# Patient Record
Sex: Female | Born: 1955 | Race: White | Hispanic: No | Marital: Married | Smoking: Former smoker
Health system: Southern US, Community
[De-identification: ages and names within clinical notes are randomized; demographics above are authoritative.]

## PROBLEM LIST (undated history)

## (undated) DIAGNOSIS — N6310 Unspecified lump in the right breast, unspecified quadrant: Secondary | ICD-10-CM

## (undated) DIAGNOSIS — E559 Vitamin D deficiency, unspecified: Secondary | ICD-10-CM

## (undated) DIAGNOSIS — E785 Hyperlipidemia, unspecified: Secondary | ICD-10-CM

## (undated) DIAGNOSIS — F419 Anxiety disorder, unspecified: Secondary | ICD-10-CM

## (undated) DIAGNOSIS — I8390 Asymptomatic varicose veins of unspecified lower extremity: Secondary | ICD-10-CM

## (undated) DIAGNOSIS — B009 Herpesviral infection, unspecified: Secondary | ICD-10-CM

## (undated) DIAGNOSIS — I739 Peripheral vascular disease, unspecified: Secondary | ICD-10-CM

## (undated) HISTORY — DX: Asymptomatic varicose veins of unspecified lower extremity: I83.90

## (undated) HISTORY — DX: Anxiety disorder, unspecified: F41.9

## (undated) HISTORY — DX: Hyperlipidemia, unspecified: E78.5

## (undated) HISTORY — DX: Vitamin D deficiency, unspecified: E55.9

## (undated) HISTORY — DX: Herpesviral infection, unspecified: B00.9

---

## 1988-11-04 HISTORY — PX: LASIK: SHX215

## 1998-03-14 ENCOUNTER — Ambulatory Visit (HOSPITAL_COMMUNITY): Admission: RE | Admit: 1998-03-14 | Discharge: 1998-03-14 | Payer: Self-pay | Admitting: *Deleted

## 1998-03-23 ENCOUNTER — Ambulatory Visit (HOSPITAL_COMMUNITY): Admission: RE | Admit: 1998-03-23 | Discharge: 1998-03-23 | Payer: Self-pay | Admitting: Obstetrics and Gynecology

## 1998-11-30 ENCOUNTER — Ambulatory Visit (HOSPITAL_COMMUNITY): Admission: RE | Admit: 1998-11-30 | Discharge: 1998-11-30 | Payer: Self-pay | Admitting: Family Medicine

## 1998-11-30 ENCOUNTER — Encounter: Payer: Self-pay | Admitting: Family Medicine

## 1999-03-26 ENCOUNTER — Ambulatory Visit (HOSPITAL_COMMUNITY): Admission: RE | Admit: 1999-03-26 | Discharge: 1999-03-26 | Payer: Self-pay | Admitting: Obstetrics and Gynecology

## 1999-03-30 ENCOUNTER — Ambulatory Visit (HOSPITAL_COMMUNITY): Admission: RE | Admit: 1999-03-30 | Discharge: 1999-03-30 | Payer: Self-pay | Admitting: Obstetrics and Gynecology

## 2000-04-01 ENCOUNTER — Ambulatory Visit (HOSPITAL_COMMUNITY): Admission: RE | Admit: 2000-04-01 | Discharge: 2000-04-01 | Payer: Self-pay | Admitting: Obstetrics and Gynecology

## 2000-04-01 ENCOUNTER — Encounter: Payer: Self-pay | Admitting: Obstetrics and Gynecology

## 2000-05-16 ENCOUNTER — Encounter: Admission: RE | Admit: 2000-05-16 | Discharge: 2000-05-16 | Payer: Self-pay | Admitting: Family Medicine

## 2000-08-13 ENCOUNTER — Encounter: Admission: RE | Admit: 2000-08-13 | Discharge: 2000-08-13 | Payer: Self-pay | Admitting: Family Medicine

## 2000-08-13 ENCOUNTER — Encounter: Payer: Self-pay | Admitting: Family Medicine

## 2000-11-12 ENCOUNTER — Encounter: Admission: RE | Admit: 2000-11-12 | Discharge: 2000-11-12 | Payer: Self-pay | Admitting: Family Medicine

## 2001-02-11 ENCOUNTER — Encounter: Admission: RE | Admit: 2001-02-11 | Discharge: 2001-02-11 | Payer: Self-pay | Admitting: Family Medicine

## 2001-02-16 ENCOUNTER — Encounter: Payer: Self-pay | Admitting: Family Medicine

## 2001-02-16 ENCOUNTER — Encounter: Admission: RE | Admit: 2001-02-16 | Discharge: 2001-02-16 | Payer: Self-pay | Admitting: Family Medicine

## 2002-03-16 ENCOUNTER — Encounter: Admission: RE | Admit: 2002-03-16 | Discharge: 2002-03-16 | Payer: Self-pay | Admitting: Family Medicine

## 2002-03-16 ENCOUNTER — Encounter: Payer: Self-pay | Admitting: Family Medicine

## 2002-06-30 ENCOUNTER — Encounter: Admission: RE | Admit: 2002-06-30 | Discharge: 2002-06-30 | Payer: Self-pay | Admitting: Family Medicine

## 2002-08-26 ENCOUNTER — Encounter: Admission: RE | Admit: 2002-08-26 | Discharge: 2002-08-26 | Payer: Self-pay | Admitting: Family Medicine

## 2002-10-15 ENCOUNTER — Other Ambulatory Visit: Admission: RE | Admit: 2002-10-15 | Discharge: 2002-10-15 | Payer: Self-pay | Admitting: Obstetrics and Gynecology

## 2003-04-15 ENCOUNTER — Encounter: Payer: Self-pay | Admitting: Sports Medicine

## 2003-04-15 ENCOUNTER — Encounter: Admission: RE | Admit: 2003-04-15 | Discharge: 2003-04-15 | Payer: Self-pay | Admitting: Sports Medicine

## 2003-09-14 ENCOUNTER — Encounter: Admission: RE | Admit: 2003-09-14 | Discharge: 2003-09-14 | Payer: Self-pay | Admitting: Family Medicine

## 2003-11-21 ENCOUNTER — Other Ambulatory Visit: Admission: RE | Admit: 2003-11-21 | Discharge: 2003-11-21 | Payer: Self-pay | Admitting: Obstetrics and Gynecology

## 2003-11-25 ENCOUNTER — Encounter: Admission: RE | Admit: 2003-11-25 | Discharge: 2003-11-25 | Payer: Self-pay | Admitting: Obstetrics and Gynecology

## 2004-04-19 ENCOUNTER — Encounter: Admission: RE | Admit: 2004-04-19 | Discharge: 2004-04-19 | Payer: Self-pay | Admitting: Obstetrics and Gynecology

## 2005-05-20 ENCOUNTER — Encounter: Admission: RE | Admit: 2005-05-20 | Discharge: 2005-05-20 | Payer: Self-pay | Admitting: Family Medicine

## 2005-06-10 ENCOUNTER — Other Ambulatory Visit: Admission: RE | Admit: 2005-06-10 | Discharge: 2005-06-10 | Payer: Self-pay | Admitting: Obstetrics and Gynecology

## 2005-06-21 ENCOUNTER — Encounter: Admission: RE | Admit: 2005-06-21 | Discharge: 2005-06-21 | Payer: Self-pay | Admitting: Family Medicine

## 2005-06-27 ENCOUNTER — Encounter: Admission: RE | Admit: 2005-06-27 | Discharge: 2005-06-27 | Payer: Self-pay | Admitting: Family Medicine

## 2005-11-28 ENCOUNTER — Encounter: Admission: RE | Admit: 2005-11-28 | Discharge: 2005-11-28 | Payer: Self-pay | Admitting: Family Medicine

## 2006-03-05 ENCOUNTER — Encounter: Payer: Self-pay | Admitting: Obstetrics and Gynecology

## 2006-07-04 ENCOUNTER — Encounter: Admission: RE | Admit: 2006-07-04 | Discharge: 2006-07-04 | Payer: Self-pay | Admitting: Obstetrics and Gynecology

## 2007-06-04 ENCOUNTER — Other Ambulatory Visit: Admission: RE | Admit: 2007-06-04 | Discharge: 2007-06-04 | Payer: Self-pay | Admitting: Family Medicine

## 2007-08-13 ENCOUNTER — Encounter: Admission: RE | Admit: 2007-08-13 | Discharge: 2007-08-13 | Payer: Self-pay | Admitting: Family Medicine

## 2007-08-18 ENCOUNTER — Encounter: Admission: RE | Admit: 2007-08-18 | Discharge: 2007-08-18 | Payer: Self-pay | Admitting: Family Medicine

## 2007-12-24 ENCOUNTER — Encounter: Admission: RE | Admit: 2007-12-24 | Discharge: 2007-12-24 | Payer: Self-pay | Admitting: Family Medicine

## 2008-04-20 ENCOUNTER — Encounter: Admission: RE | Admit: 2008-04-20 | Discharge: 2008-04-20 | Payer: Self-pay | Admitting: Gastroenterology

## 2008-07-28 ENCOUNTER — Other Ambulatory Visit: Admission: RE | Admit: 2008-07-28 | Discharge: 2008-07-28 | Payer: Self-pay | Admitting: Family Medicine

## 2008-08-24 ENCOUNTER — Encounter: Admission: RE | Admit: 2008-08-24 | Discharge: 2008-08-24 | Payer: Self-pay | Admitting: Family Medicine

## 2008-09-12 ENCOUNTER — Encounter: Admission: RE | Admit: 2008-09-12 | Discharge: 2008-09-12 | Payer: Self-pay | Admitting: Family Medicine

## 2009-10-10 ENCOUNTER — Encounter: Admission: RE | Admit: 2009-10-10 | Discharge: 2009-10-10 | Payer: Self-pay | Admitting: Family Medicine

## 2010-02-09 ENCOUNTER — Other Ambulatory Visit: Admission: RE | Admit: 2010-02-09 | Discharge: 2010-02-09 | Payer: Self-pay | Admitting: Family Medicine

## 2010-02-21 ENCOUNTER — Ambulatory Visit: Payer: Self-pay | Admitting: Vascular Surgery

## 2010-03-05 ENCOUNTER — Ambulatory Visit (HOSPITAL_COMMUNITY): Admission: RE | Admit: 2010-03-05 | Discharge: 2010-03-05 | Payer: Self-pay | Admitting: Family Medicine

## 2010-11-24 ENCOUNTER — Encounter: Payer: Self-pay | Admitting: Family Medicine

## 2010-11-25 ENCOUNTER — Encounter: Payer: Self-pay | Admitting: Family Medicine

## 2010-11-25 ENCOUNTER — Encounter: Payer: Self-pay | Admitting: Obstetrics and Gynecology

## 2011-03-19 NOTE — Consult Note (Signed)
NEW PATIENT CONSULTATION   Lauren Fry, TIEGS [facility]  DOB:  February 10, 1956                                       02/21/2010  NWGNF#:[id number]   The patient presents today for evaluation of bilateral leg discomfort  and vein pathology.  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  She has  not had any history of DVT or bleeding from these.  She does have aching  over this area with what she describes as a shooting pain quality over  her pretibial areas bilaterally.  She does not relate this to long  periods of standing.   PAST MEDICAL HISTORY:  Her past history is negative for major medical  difficulties specifically no cardiac disease and no hypertension.   FAMILY HISTORY:  Is positive for significant varicosities in her mother,  had a pacemaker placed in her father.   SOCIAL HISTORY:  She has three children.  She works as a Animator.  She quit smoking 25 years ago and has approximately 3  glasses of wine per week.   REVIEW OF SYSTEMS:  Review of systems is completely negative aside from  documented above.   PHYSICAL EXAM:  General:  A well-developed, thin white female appearing  stated age.  Vital signs:  Blood pressure is 103/65, pulse 67,  respirations 18.  In general she is well-developed and in no acute  distress.  HEENT:  Normal.  Musculoskeletal:  No major deformities or  cyanosis.  Neurological:  No focal weakness, paresthesias.  Skin:  Without ulcers or rashes.  She does have prominent telangiectasia over  her thighs and most markedly down on to her pretibial area and on to her  ankles.  She does have some reticular veins as well.   She underwent formal venous duplex in our office and this reveals no  evidence of DVT.  She does have mild deep venous reflux.  She does have  mild insignificant reflux in her  right great saphenous vein and slightly  more significant reflux in her left great saphenous vein.  This is only  when she is standing in upright position.   I had a long discussion with the patient.  I explained that she does  have mild incompetence but certainly would not recommend any aggressive  treatment such as laser ablation of her saphenous vein due to this.  I  do not feel that her main component of concern which is the shooting  pains in her pretibial area is related to her venous pathology.  I did  discuss the potential treatment for her telangiectasia and reticular  veins.  I explained this would be sclerotherapy and would certainly give  an improved cosmetic result.  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  She will notify us should she wish to proceed with  sclerotherapy of her telangiectasia.     Larina Earthly, M.D.  Electronically Signed   TFE/MEDQ  D:  02/21/2010  T:  02/22/2010  Job:  [id number]   cc:   Stacie Acres. Cliffton Asters, M.D.

## 2011-03-19 NOTE — Procedures (Signed)
LOWER EXTREMITY VENOUS REFLUX EXAM   INDICATION:  Bilateral telangiectasia.   EXAM:  Using color-flow imaging and pulse Doppler spectral analysis, the  right and left common femoral, superficial femoral, popliteal, posterior  tibial, greater and lesser saphenous veins are evaluated.  There is  evidence suggesting deep venous insufficiency in the right and left  lower extremities.   The left saphenofemoral junction is not competent with Reflux of  >571milliseconds. The right is competent.  The left GSV is not competent  when upright with reflux of >500 milliseconds with the caliber as  described below.  The right is within normal limits.   The right and left proximal short saphenous veins demonstrate  competency.   GSV Diameter (used if found to be incompetent only)                                            Right            Left  Proximal Greater Saphenous Vein           cm               0.57 cm  Proximal-to-mid-thigh                     cm               cm  Mid thigh                                 cm               0.44 cm  Mid-distal thigh                          cm               cm  Distal thigh                              cm               0.45 cm  Knee                                      cm               0.4 cm   IMPRESSION:  1. When upright, left greater saphenous vein reflux with      >5100milliseconds is identified with the caliber ranging from 0.4 cm      to 0.57 cm knee to groin.  The right is within normal limits.  2. The right and left greater saphenous veins are not aneurysmal.  3. The right and left greater saphenous veins are not tortuous.  4. The deep venous system is not competent with reflux of      >536milliseconds.  5. The right and left lesser saphenous veins are competent.         ___________________________________________  Larina Earthly, M.D.   AS/MEDQ  D:  02/21/2010  T:  02/21/2010  Job:  [id number]

## 2011-04-18 ENCOUNTER — Ambulatory Visit
Admission: RE | Admit: 2011-04-18 | Discharge: 2011-04-18 | Disposition: A | Discharge status: Home / Self Care | Payer: BC Managed Care – PPO | Source: Ambulatory Visit | Attending: Family Medicine | Admitting: Family Medicine

## 2011-04-18 ENCOUNTER — Other Ambulatory Visit: Payer: Self-pay | Admitting: Family Medicine

## 2011-04-18 DIAGNOSIS — Z1231 Encounter for screening mammogram for malignant neoplasm of breast: Secondary | ICD-10-CM

## 2012-06-01 ENCOUNTER — Other Ambulatory Visit (HOSPITAL_COMMUNITY): Payer: Self-pay | Admitting: Internal Medicine

## 2012-06-01 ENCOUNTER — Other Ambulatory Visit: Payer: Self-pay | Admitting: Internal Medicine

## 2012-06-01 DIAGNOSIS — Q782 Osteopetrosis: Secondary | ICD-10-CM

## 2012-06-01 DIAGNOSIS — Z1231 Encounter for screening mammogram for malignant neoplasm of breast: Secondary | ICD-10-CM

## 2012-06-02 ENCOUNTER — Ambulatory Visit (HOSPITAL_COMMUNITY)
Admission: RE | Admit: 2012-06-02 | Discharge: 2012-06-02 | Disposition: A | Discharge status: Home / Self Care | Payer: BC Managed Care – PPO | Source: Ambulatory Visit | Attending: Internal Medicine | Admitting: Internal Medicine

## 2012-06-02 DIAGNOSIS — Z780 Asymptomatic menopausal state: Secondary | ICD-10-CM | POA: Insufficient documentation

## 2012-06-02 DIAGNOSIS — Q782 Osteopetrosis: Secondary | ICD-10-CM

## 2012-06-02 DIAGNOSIS — Z13820 Encounter for screening for osteoporosis: Secondary | ICD-10-CM | POA: Insufficient documentation

## 2012-06-03 ENCOUNTER — Other Ambulatory Visit: Payer: Self-pay | Admitting: Emergency Medicine

## 2012-06-03 ENCOUNTER — Other Ambulatory Visit (HOSPITAL_COMMUNITY)
Admission: RE | Admit: 2012-06-03 | Discharge: 2012-06-03 | Disposition: A | Discharge status: Home / Self Care | Payer: BC Managed Care – PPO | Source: Ambulatory Visit | Attending: Unknown Physician Specialty | Admitting: Unknown Physician Specialty

## 2012-06-03 ENCOUNTER — Ambulatory Visit
Admission: RE | Admit: 2012-06-03 | Discharge: 2012-06-03 | Disposition: A | Discharge status: Home / Self Care | Payer: BC Managed Care – PPO | Source: Ambulatory Visit | Attending: Internal Medicine | Admitting: Internal Medicine

## 2012-06-03 DIAGNOSIS — Z01419 Encounter for gynecological examination (general) (routine) without abnormal findings: Secondary | ICD-10-CM | POA: Insufficient documentation

## 2012-06-03 DIAGNOSIS — Z1231 Encounter for screening mammogram for malignant neoplasm of breast: Secondary | ICD-10-CM

## 2012-06-03 LAB — HM MAMMOGRAPHY: HM Mammogram: NEGATIVE

## 2012-06-04 LAB — HM PAP SMEAR: HM Pap smear: NEGATIVE

## 2013-02-02 ENCOUNTER — Other Ambulatory Visit: Payer: Self-pay | Admitting: Gastroenterology

## 2013-02-02 DIAGNOSIS — R109 Unspecified abdominal pain: Secondary | ICD-10-CM

## 2013-02-05 ENCOUNTER — Ambulatory Visit
Admission: RE | Admit: 2013-02-05 | Discharge: 2013-02-05 | Disposition: A | Discharge status: Home / Self Care | Payer: BC Managed Care – PPO | Source: Ambulatory Visit | Attending: Gastroenterology | Admitting: Gastroenterology

## 2013-02-05 DIAGNOSIS — R109 Unspecified abdominal pain: Secondary | ICD-10-CM

## 2013-09-09 ENCOUNTER — Telehealth: Payer: Self-pay | Admitting: *Deleted

## 2013-09-10 NOTE — Telephone Encounter (Signed)
Please advise what you would like for me to do for this pt.

## 2013-09-10 NOTE — Telephone Encounter (Signed)
This patient will actually need an appointment here before we can submit order for diagnostic mammogram.

## 2013-09-12 NOTE — Telephone Encounter (Signed)
Did this patient get scheduled for an office visit as I instructed previously. Please and thank you.

## 2013-09-13 ENCOUNTER — Encounter: Payer: Self-pay | Admitting: Emergency Medicine

## 2013-09-13 ENCOUNTER — Ambulatory Visit: Payer: BC Managed Care – PPO | Admitting: Emergency Medicine

## 2013-09-13 VITALS — BP 104/58 | HR 64 | Temp 98.0°F | Resp 16 | Ht 67.0 in | Wt 134.0 lb

## 2013-09-13 DIAGNOSIS — N644 Mastodynia: Secondary | ICD-10-CM

## 2013-09-13 DIAGNOSIS — E785 Hyperlipidemia, unspecified: Secondary | ICD-10-CM | POA: Insufficient documentation

## 2013-09-13 DIAGNOSIS — E559 Vitamin D deficiency, unspecified: Secondary | ICD-10-CM | POA: Insufficient documentation

## 2013-09-13 DIAGNOSIS — F419 Anxiety disorder, unspecified: Secondary | ICD-10-CM

## 2013-09-13 NOTE — Progress Notes (Signed)
  Subjective:    Patient ID: Lauren Fry, female    DOB: 1956-05-28, 57 y.o.   MRN: [id number]  HPI Comments: 57 yo female presents with left breast pain/ tenderness without strain or injury or noticeable lump. Patient admits to mild bruising over area of concern, but notes bruises easily. Patient is due for [facility] and has NEG PMH/ [facility] for female cancer. She has 2 1st line relatives with colon ca.   MEDICATIONS Celexa 40mg   Asa 81mg  Os-cal 600mg  D 5000IU Fish oil 2g Mag 250mg  MVIT Ocuvite Vit C 1g Vit E 400  ALLERGIES NKDA   Review of Systems  Constitutional:       Left breast pain   All other systems reviewed and are negative.       Objective:   Physical Exam  Nursing note and vitals reviewed. Constitutional: She appears well-developed and well-nourished.  Neck: Normal range of motion.  Cardiovascular: Normal rate, regular rhythm, normal heart sounds and intact distal pulses.   Pulmonary/Chest: Effort normal and breath sounds normal.  Genitourinary:  Left breast 2-3 o'clock with tenderness, without obvious mass, with mild ecchymosis.  Musculoskeletal: Normal range of motion.  Neurological: She is alert.  Skin: Skin is warm and dry.  Psychiatric: She has a normal mood and affect. Her behavior is normal. Judgment and thought content normal.          Assessment & Plan:  Left breast tenderness, get yearly screening of both and DX and u/s of left breast.

## 2013-09-13 NOTE — Patient Instructions (Signed)
Breast Tenderness Breast tenderness is a common problem for women of all ages. Breast tenderness may cause mild discomfort to severe pain. It has a variety of causes. Your health care provider will find out the likely cause of your breast tenderness by examining your breasts, asking you about symptoms, and ordering some tests. Breast tenderness usually does not mean you have breast cancer. HOME CARE INSTRUCTIONS  Breast tenderness often can be handled at home. You can try:  Getting fitted for a new bra that provides more support, especially during exercise.  Wearing a more supportive bra or sports bra while sleeping when your breasts are very tender.  If you have a breast injury, apply ice to the area:  Put ice in a plastic bag.  Place a towel between your skin and the bag.  Leave the ice on for 20 minutes, 2 3 times a day.  If your breasts are too full of milk as a result of breastfeeding, try:  Expressing milk either by hand or with a breast pump.  Applying a warm compress to the breasts for relief.  Taking over-the-counter pain relievers, if approved by your health care provider.  Taking other medicines that your health care provider prescribes. These may include antibiotic medicines or birth control pills. Over the long term, your breast tenderness might be eased if you:  Cut down on caffeine.  Reduce the amount of fat in your diet. Keep a log of the days and times when your breasts are most tender. This will help you and your health care provider find the cause of the tenderness and how to relieve it. Also, learn how to do breast exams at home. This will help you notice if you have an unusual growth or lump that could cause tenderness. SEEK MEDICAL CARE IF:   Any part of your breast is hard, red, and hot to the touch. This could be a sign of infection.  Fluid is coming out of your nipples (and you are not breastfeeding). Especially watch for blood or pus.  You have a fever  as well as breast tenderness.  You have a new or painful lump in your breast that remains after your menstrual period ends.  You have tried to take care of the pain at home, but it has not gone away.  Your breast pain is getting worse, or the pain is making it hard to do the things you usually do during your day. Document Released: 10/03/2008 Document Revised: 06/23/2013 Document Reviewed: 05/20/2013 Exit[employer] Patient Information 2014 [employer].  

## 2013-10-07 ENCOUNTER — Ambulatory Visit
Admission: RE | Admit: 2013-10-07 | Discharge: 2013-10-07 | Disposition: A | Discharge status: Home / Self Care | Payer: BC Managed Care – PPO | Source: Ambulatory Visit | Attending: Emergency Medicine | Admitting: Emergency Medicine

## 2013-10-07 DIAGNOSIS — N644 Mastodynia: Secondary | ICD-10-CM

## 2013-12-03 ENCOUNTER — Encounter (HOSPITAL_COMMUNITY): Payer: Self-pay | Admitting: Emergency Medicine

## 2013-12-03 ENCOUNTER — Emergency Department (HOSPITAL_COMMUNITY)
Admission: EM | Admit: 2013-12-03 | Discharge: 2013-12-03 | Disposition: A | Discharge status: Home / Self Care | Payer: BC Managed Care – PPO | Attending: Emergency Medicine | Admitting: Emergency Medicine

## 2013-12-03 ENCOUNTER — Emergency Department (HOSPITAL_COMMUNITY): Payer: BC Managed Care – PPO

## 2013-12-03 DIAGNOSIS — Z8639 Personal history of other endocrine, nutritional and metabolic disease: Secondary | ICD-10-CM | POA: Insufficient documentation

## 2013-12-03 DIAGNOSIS — E785 Hyperlipidemia, unspecified: Secondary | ICD-10-CM | POA: Insufficient documentation

## 2013-12-03 DIAGNOSIS — Z8619 Personal history of other infectious and parasitic diseases: Secondary | ICD-10-CM | POA: Insufficient documentation

## 2013-12-03 DIAGNOSIS — F411 Generalized anxiety disorder: Secondary | ICD-10-CM | POA: Insufficient documentation

## 2013-12-03 DIAGNOSIS — S20219A Contusion of unspecified front wall of thorax, initial encounter: Secondary | ICD-10-CM

## 2013-12-03 DIAGNOSIS — Z7982 Long term (current) use of aspirin: Secondary | ICD-10-CM | POA: Insufficient documentation

## 2013-12-03 DIAGNOSIS — Z87891 Personal history of nicotine dependence: Secondary | ICD-10-CM | POA: Insufficient documentation

## 2013-12-03 DIAGNOSIS — R079 Chest pain, unspecified: Secondary | ICD-10-CM | POA: Insufficient documentation

## 2013-12-03 DIAGNOSIS — Z79899 Other long term (current) drug therapy: Secondary | ICD-10-CM | POA: Insufficient documentation

## 2013-12-03 DIAGNOSIS — G8911 Acute pain due to trauma: Secondary | ICD-10-CM | POA: Insufficient documentation

## 2013-12-03 LAB — CBC
HCT: 38.7 % (ref 36.0–46.0)
Hemoglobin: 12.9 g/dL (ref 12.0–15.0)
MCH: 31.2 pg (ref 26.0–34.0)
MCHC: 33.3 g/dL (ref 30.0–36.0)
MCV: 93.7 fL (ref 78.0–100.0)
Platelets: 172 10*3/uL (ref 150–400)
RBC: 4.13 MIL/uL (ref 3.87–5.11)
RDW: 13 % (ref 11.5–15.5)
WBC: 6 10*3/uL (ref 4.0–10.5)

## 2013-12-03 LAB — BASIC METABOLIC PANEL
BUN: 19 mg/dL (ref 6–23)
CO2: 25 meq/L (ref 19–32)
Calcium: 9.1 mg/dL (ref 8.4–10.5)
Chloride: 98 mEq/L (ref 96–112)
Creatinine, Ser: 0.72 mg/dL (ref 0.50–1.10)
GFR calc Af Amer: >90 mL/min (ref >90)
Glucose, Bld: 93 mg/dL (ref 70–99)
POTASSIUM: 4.4 meq/L (ref 3.7–5.3)
SODIUM: 136 meq/L — AB (ref 137–147)

## 2013-12-03 LAB — POCT I-STAT TROPONIN I: TROPONIN I, POC: 0 ng/mL (ref 0.00–0.08)

## 2013-12-03 MED ORDER — HYDROCODONE-ACETAMINOPHEN 5-325 MG PO TABS: 2.0000 | ORAL_TABLET | ORAL | Status: DC | PRN

## 2013-12-03 MED ORDER — IBUPROFEN 800 MG PO TABS: 800.0000 mg | ORAL_TABLET | Freq: Three times a day (TID) | ORAL | Status: DC

## 2013-12-03 NOTE — ED Notes (Signed)
The tech assisted the patient to restroom, the patients gait was steady. The patient complained of heavy pressure on left side; while ambulating to and from restroom. The tech has reported to RN in charge.

## 2013-12-03 NOTE — Discharge Instructions (Signed)
Chest Contusion °A chest contusion is a deep bruise on your chest area. Contusions are the result of an injury that caused bleeding under the skin. A chest contusion may involve bruising of the skin, muscles, or ribs. The contusion may turn blue, purple, or yellow. Minor injuries will give you a painless contusion, but more severe contusions may stay painful and swollen for a few weeks. °CAUSES  °A contusion is usually caused by a blow, trauma, or direct force to an area of the body. °SYMPTOMS  °· Swelling and redness of the injured area. °· Discoloration of the injured area. °· Tenderness and soreness of the injured area. °· Pain. °DIAGNOSIS  °The diagnosis can be made by taking a history and performing a physical exam. An X-ray, CT scan, or MRI may be needed to determine if there were any associated injuries, such as broken bones (fractures) or internal injuries. °TREATMENT  °Often, the best treatment for a chest contusion is resting, icing, and applying cold compresses to the injured area. Deep breathing exercises may be recommended to reduce the risk of pneumonia. Over-the-counter medicines may also be recommended for pain control. °HOME CARE INSTRUCTIONS  °· Put ice on the injured area. °· Put ice in a plastic bag. °· Place a towel between your skin and the bag. °· Leave the ice on for 15-20 minutes, 03-04 times a day. °· Only take over-the-counter or prescription medicines as directed by your caregiver. Your caregiver may recommend avoiding anti-inflammatory medicines (aspirin, ibuprofen, and naproxen) for 48 hours because these medicines may increase bruising. °· Rest the injured area. °· Perform deep-breathing exercises as directed by your caregiver. °· Stop smoking if you smoke. °· Do not lift objects over 5 pounds (2.3 kg) for 3 days or longer if recommended by your caregiver. °SEEK IMMEDIATE MEDICAL CARE IF:  °· You have increased bruising or swelling. °· You have pain that is getting worse. °· You have  difficulty breathing. °· You have dizziness, weakness, or fainting. °· You have blood in your urine or stool. °· You cough up or vomit blood. °· Your swelling or pain is not relieved with medicines. °MAKE SURE YOU:  °· Understand these instructions. °· Will watch your condition. °· Will get help right away if you are not doing well or get worse. °Document Released: 07/16/2001 Document Revised: 07/15/2012 Document Reviewed: 04/13/2012 °ExitCare® Patient Information ©2014 [employer]. ° °
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
## 2013-12-05 NOTE — ED Provider Notes (Signed)
Medical screening examination/treatment/procedure(s) were performed by non-physician practitioner and as supervising physician I was immediately available for consultation/collaboration.  EKG Interpretation    Date/Time:  Friday December 03 2013 17:49:06 EST Ventricular Rate:  58 PR Interval:  94 QRS Duration: 84 QT Interval:  430 QTC Calculation: 422 R Axis:   83 Text Interpretation:  Sinus bradycardia with short PR Otherwise normal ECG Confirmed by Alvino Chapel  MD, Joscelyn Hardrick ([id number]) on 12/05/2013 7:35:40 AM             Jasper Riling. Alvino Chapel, MD 12/05/13 [phone]

## 2014-01-31 ENCOUNTER — Other Ambulatory Visit: Payer: Self-pay | Admitting: Internal Medicine

## 2014-02-14 ENCOUNTER — Encounter: Payer: Self-pay | Admitting: Internal Medicine

## 2014-03-09 ENCOUNTER — Ambulatory Visit (INDEPENDENT_AMBULATORY_CARE_PROVIDER_SITE_OTHER): Payer: BC Managed Care – PPO | Admitting: Internal Medicine

## 2014-03-09 ENCOUNTER — Encounter: Payer: Self-pay | Admitting: Internal Medicine

## 2014-03-09 VITALS — BP 114/68 | HR 56 | Temp 97.9°F | Resp 16 | Ht 67.0 in | Wt 135.6 lb

## 2014-03-09 DIAGNOSIS — R030 Elevated blood-pressure reading, without diagnosis of hypertension: Secondary | ICD-10-CM

## 2014-03-09 DIAGNOSIS — Z79899 Other long term (current) drug therapy: Secondary | ICD-10-CM

## 2014-03-09 DIAGNOSIS — Z111 Encounter for screening for respiratory tuberculosis: Secondary | ICD-10-CM

## 2014-03-09 DIAGNOSIS — Z1212 Encounter for screening for malignant neoplasm of rectum: Secondary | ICD-10-CM

## 2014-03-09 DIAGNOSIS — Z23 Encounter for immunization: Secondary | ICD-10-CM

## 2014-03-09 DIAGNOSIS — R7402 Elevation of levels of lactic acid dehydrogenase (LDH): Secondary | ICD-10-CM

## 2014-03-09 DIAGNOSIS — R740 Nonspecific elevation of levels of transaminase and lactic acid dehydrogenase [LDH]: Secondary | ICD-10-CM

## 2014-03-09 DIAGNOSIS — E559 Vitamin D deficiency, unspecified: Secondary | ICD-10-CM

## 2014-03-09 DIAGNOSIS — I10 Essential (primary) hypertension: Secondary | ICD-10-CM

## 2014-03-09 DIAGNOSIS — Z0000 Encounter for general adult medical examination without abnormal findings: Secondary | ICD-10-CM

## 2014-03-09 DIAGNOSIS — Z113 Encounter for screening for infections with a predominantly sexual mode of transmission: Secondary | ICD-10-CM

## 2014-03-09 LAB — HEMOGLOBIN A1C
HEMOGLOBIN A1C: 5.1 % (ref <5.7)
Mean Plasma Glucose: 100 mg/dL (ref <117)

## 2014-03-09 LAB — CBC WITH DIFFERENTIAL/PLATELET
Basophils Absolute: 0.1 10*3/uL (ref 0.0–0.1)
Basophils Relative: 1 % (ref 0–1)
EOS ABS: 0.2 10*3/uL (ref 0.0–0.7)
EOS PCT: 3 % (ref 0–5)
HEMATOCRIT: 40.6 % (ref 36.0–46.0)
HEMOGLOBIN: 13.4 g/dL (ref 12.0–15.0)
LYMPHS PCT: 31 % (ref 12–46)
Lymphs Abs: 1.7 10*3/uL (ref 0.7–4.0)
MCH: 31.2 pg (ref 26.0–34.0)
MCHC: 33 g/dL (ref 30.0–36.0)
MCV: 94.4 fL (ref 78.0–100.0)
MONO ABS: 0.4 10*3/uL (ref 0.1–1.0)
MONOS PCT: 8 % (ref 3–12)
Neutro Abs: 3.1 10*3/uL (ref 1.7–7.7)
Neutrophils Relative %: 57 % (ref 43–77)
Platelets: 231 10*3/uL (ref 150–400)
RBC: 4.3 MIL/uL (ref 3.87–5.11)
RDW: 13.8 % (ref 11.5–15.5)
WBC: 5.4 10*3/uL (ref 4.0–10.5)

## 2014-03-09 NOTE — Progress Notes (Signed)
Patient ID: Lauren Fry, female   DOB: 10-Nov-1955, 58 y.o.   MRN: [id number]   Annual Screening Comprehensive Examination   This very nice 58 y.o.DWF presents for complete physical.  Patient has no major health issues. She has had slightly elevated chol & LDL in the past which improved dramatically with diet to goal. She exercises regularly with aerobics and occasionally with weights. In jan 2012 her A1c was 5.7% and dropped to 5.3% in Mar 2014.    Finally, patient has history of Vitamin D Deficiency which she supplements.  Medication Sig  . aspirin 81 MG chewable tablet Chew 81 mg by mouth daily.  . OS-CAL 600 mg Take 600 mg by mouth 2 (two) times daily with a meal.  . cholecalciferol (VITAMIN D)  Take 5,000 Units by mouth daily.   . citalopram (CELEXA) 40 MG tablet TAKE 1 TABLET BY MOUTH EVERY DAY FOR MOOD  . fish oil 1000 MG  Take 1 g by mouth 2 (two) times daily.   Marland Kitchen ibuprofen (ADVIL,MOTRIN) 200 MG tablet Take 600 mg by mouth daily as needed for mild pain.  . Magnesium 250 MG TABS Take 250 mg by mouth daily.   . Multiple Vitamins-Minerals  Take 1 tablet by mouth daily.  .  (OCUVITE-LUTEIN) CAPS Take 1 capsule by mouth daily.  . vitamin C 500 MG  Take 500 mg by mouth 2 (two) times daily.  . vitamin E 400 UNIT capsule Take 400 Units by mouth daily.   No Known Allergies  Past Medical History  Diagnosis Date  . Anxiety   . Hyperlipidemia   . Unspecified vitamin D deficiency   . HSV-2 (herpes simplex virus 2) infection    Past Surgical History  Procedure Laterality Date  . Lasik  1990  . Lasik  1990   Family History  Problem Relation Age of Onset  . Cancer Mother 51    lung  . Dementia Father   . Cancer Sister 16    colon  . Cancer Brother 52    colon   History   Social History  . Marital Status: Divorced    Spouse Name: N/A    Number of Children: N/A  . Years of Education: N/A   Occupational History  . Not on file.   Social History Main Topics  .  Smoking status: Former Smoker    Quit date: 11/04/1982  . Smokeless tobacco: Not on file  . Alcohol Use: 0.6 oz/week    1 Glasses of wine per week  . Drug Use: No  . Sexual Activity: Yes    Birth Control/ Protection: None     ROS Constitutional: Denies fever, chills, weight loss/gain, headaches, insomnia, fatigue, night sweats, and change in appetite. Eyes: Denies redness, blurred vision, diplopia, discharge, itchy, watery eyes.  ENT: Denies discharge, congestion, post nasal drip, epistaxis, sore throat, earache, hearing loss, dental pain, Tinnitus, Vertigo, Sinus pain, snoring.  Cardio: Denies chest pain, palpitations, irregular heartbeat, syncope, dyspnea, diaphoresis, orthopnea, PND, claudication, edema Respiratory: denies cough, dyspnea, DOE, pleurisy, hoarseness, laryngitis, wheezing.  Gastrointestinal: Denies dysphagia, heartburn, reflux, water brash, pain, cramps, nausea, vomiting, bloating, diarrhea, constipation, hematemesis, melena, hematochezia, jaundice, hemorrhoids Genitourinary: Denies dysuria, frequency, urgency, nocturia, hesitancy, discharge, hematuria, flank pain Breast: Breast lumps, nipple discharge, bleeding.  Musculoskeletal: Denies arthralgia, myalgia, stiffness, Jt. Swelling, pain, limp, and strain/sprain. Skin: Denies puritis, rash, hives, warts, acne, eczema, changing in skin lesion Neuro: Weakness, tremor, incoordination, spasms, paresthesia, pain Psychiatric: Denies confusion, memory loss, sensory  loss. Endocrine: Denies change in weight, skin, hair change, nocturia, and paresthesia, diabetic polys, visual blurring, hyper /hypo glycemic episodes.  Heme/Lymph: No excessive bleeding, bruising, enlarged lymph nodes.   Physical Exam  BP 114/68  Pulse 56  Temp97.9 F (36.6 C)   Resp 16  Ht 5\' 7"    Wt 135 lb 9.6 oz   BMI 21.23 kg/m2   General Appearance: Well nourished, in no apparent distress. Eyes: PERRLA, EOMs, conjunctiva no swelling or erythema,  normal fundi and vessels. Sinuses: No frontal/maxillary tenderness ENT/Mouth: EACs patent / TMs  nl. Nares clear without erythema, swelling, mucoid exudates. Oral hygiene is good. No erythema, swelling, or exudate. Tongue normal, non-obstructing. Tonsils not swollen or erythematous. Hearing normal.  Neck: Supple, thyroid normal. No bruits, nodes or JVD. Respiratory: Respiratory effort normal.  BS equal and clear bilateral without rales, rhonci, wheezing or stridor. Cardio: Heart sounds are normal with regular rate and rhythm and no murmurs, rubs or gallops. Peripheral pulses are normal and equal bilaterally without edema. No aortic or femoral bruits. Chest: symmetric with normal excursions and percussion. Breasts: Symmetric, without lumps, nipple discharge, retractions, or fibrocystic changes.  Abdomen: Flat, soft, with bowl sounds. Nontender, no guarding, rebound, hernias, masses, or organomegaly.  Lymphatics: Non tender without lymphadenopathy.  Musculoskeletal: Full ROM all peripheral extremities, joint stability, 5/5 strength, and normal gait. Skin: Warm and dry without rashes, lesions, cyanosis, clubbing or  ecchymosis.  Neuro: Cranial nerves intact, reflexes equal bilaterally. Normal muscle tone, no cerebellar symptoms. Sensation intact.  Pysch: Awake and oriented X 3, normal affect, Insight and Judgment appropriate.   Assessment and Plan  1. Annual Screening Examination 2. Vitamin D Deficiency  Continue prudent diet as discussed, weight control, regular exercise, and medications. Routine screening labs and tests as requested with regular follow-up as recommended.

## 2014-03-09 NOTE — Patient Instructions (Signed)

## 2014-03-10 LAB — RPR

## 2014-03-10 LAB — MICROALBUMIN / CREATININE URINE RATIO
CREATININE, URINE: 48.9 mg/dL
MICROALB UR: 0.5 mg/dL (ref 0.00–1.89)
Microalb Creat Ratio: 10.2 mg/g (ref 0.0–30.0)

## 2014-03-10 LAB — BASIC METABOLIC PANEL WITH GFR
BUN: 16 mg/dL (ref 6–23)
CALCIUM: 9 mg/dL (ref 8.4–10.5)
CO2: 23 meq/L (ref 19–32)
CREATININE: 0.73 mg/dL (ref 0.50–1.10)
Chloride: 101 mEq/L (ref 96–112)
GFR, Est African American: >89 mL/min
GFR, Est Non African American: >89 mL/min
GLUCOSE: 80 mg/dL (ref 70–99)
Potassium: 4.5 mEq/L (ref 3.5–5.3)
Sodium: 138 mEq/L (ref 135–145)

## 2014-03-10 LAB — HEPATIC FUNCTION PANEL
ALBUMIN: 4.3 g/dL (ref 3.5–5.2)
ALT: 14 U/L (ref 0–35)
AST: 23 U/L (ref 0–37)
Alkaline Phosphatase: 56 U/L (ref 39–117)
Bilirubin, Direct: 0.1 mg/dL (ref 0.0–0.3)
Indirect Bilirubin: 0.5 mg/dL (ref 0.2–1.2)
Total Bilirubin: 0.6 mg/dL (ref 0.2–1.2)
Total Protein: 6.6 g/dL (ref 6.0–8.3)

## 2014-03-10 LAB — HEPATITIS B SURFACE ANTIBODY,QUALITATIVE: Hep B S Ab: NEGATIVE

## 2014-03-10 LAB — HIV ANTIBODY (ROUTINE TESTING W REFLEX): HIV 1&2 Ab, 4th Generation: NONREACTIVE

## 2014-03-10 LAB — HEPATITIS A ANTIBODY, TOTAL: HEP A TOTAL AB: NONREACTIVE

## 2014-03-10 LAB — TSH: TSH: 1.748 u[IU]/mL (ref 0.350–4.500)

## 2014-03-10 LAB — URINALYSIS, MICROSCOPIC ONLY
Bacteria, UA: NONE SEEN
Casts: NONE SEEN
Crystals: NONE SEEN
Squamous Epithelial / LPF: NONE SEEN

## 2014-03-10 LAB — LIPID PANEL
CHOL/HDL RATIO: 2.7 ratio
Cholesterol: 191 mg/dL (ref 0–200)
HDL: 70 mg/dL (ref >39)
LDL Cholesterol: 102 mg/dL — ABNORMAL HIGH (ref 0–99)
Triglycerides: 96 mg/dL (ref <150)
VLDL: 19 mg/dL (ref 0–40)

## 2014-03-10 LAB — INSULIN, FASTING: INSULIN FASTING, SERUM: 4 u[IU]/mL (ref 3–28)

## 2014-03-10 LAB — MAGNESIUM: MAGNESIUM: 2.2 mg/dL (ref 1.5–2.5)

## 2014-03-10 LAB — HEPATITIS B CORE ANTIBODY, TOTAL: HEP B C TOTAL AB: NONREACTIVE

## 2014-03-10 LAB — VITAMIN B12: VITAMIN B 12: 982 pg/mL — AB (ref 211–911)

## 2014-03-10 LAB — VITAMIN D 25 HYDROXY (VIT D DEFICIENCY, FRACTURES): Vit D, 25-Hydroxy: 76 ng/mL (ref 30–89)

## 2014-03-10 LAB — HEPATITIS C ANTIBODY: HCV AB: NEGATIVE

## 2014-03-11 LAB — HEPATITIS B E ANTIBODY: Hepatitis Be Antibody: NONREACTIVE

## 2014-03-11 LAB — TB SKIN TEST
Induration: 0 mm
TB Skin Test: NEGATIVE

## 2014-04-04 ENCOUNTER — Other Ambulatory Visit (INDEPENDENT_AMBULATORY_CARE_PROVIDER_SITE_OTHER): Payer: BC Managed Care – PPO

## 2014-04-04 DIAGNOSIS — Z1212 Encounter for screening for malignant neoplasm of rectum: Secondary | ICD-10-CM

## 2014-04-04 LAB — POC HEMOCCULT BLD/STL (HOME/3-CARD/SCREEN)
Card #3 Fecal Occult Blood, POC: NEGATIVE
FECAL OCCULT BLD: NEGATIVE
FECAL OCCULT BLD: NEGATIVE

## 2014-06-02 ENCOUNTER — Other Ambulatory Visit: Payer: Self-pay | Admitting: Internal Medicine

## 2014-08-26 ENCOUNTER — Ambulatory Visit (INDEPENDENT_AMBULATORY_CARE_PROVIDER_SITE_OTHER): Payer: BC Managed Care – PPO | Admitting: *Deleted

## 2014-08-26 DIAGNOSIS — Z23 Encounter for immunization: Secondary | ICD-10-CM

## 2014-10-06 ENCOUNTER — Ambulatory Visit: Payer: Self-pay | Admitting: Physician Assistant

## 2014-10-12 ENCOUNTER — Other Ambulatory Visit (HOSPITAL_COMMUNITY)
Admission: RE | Admit: 2014-10-12 | Discharge: 2014-10-12 | Disposition: A | Discharge status: Home / Self Care | Payer: BC Managed Care – PPO | Source: Ambulatory Visit | Attending: Physician Assistant | Admitting: Physician Assistant

## 2014-10-12 ENCOUNTER — Ambulatory Visit (INDEPENDENT_AMBULATORY_CARE_PROVIDER_SITE_OTHER): Payer: BC Managed Care – PPO | Admitting: Physician Assistant

## 2014-10-12 ENCOUNTER — Encounter: Payer: Self-pay | Admitting: Physician Assistant

## 2014-10-12 VITALS — BP 102/64 | HR 60 | Temp 98.3°F | Resp 16 | Ht 67.0 in | Wt 140.0 lb

## 2014-10-12 DIAGNOSIS — F419 Anxiety disorder, unspecified: Secondary | ICD-10-CM

## 2014-10-12 DIAGNOSIS — Z01411 Encounter for gynecological examination (general) (routine) with abnormal findings: Secondary | ICD-10-CM | POA: Diagnosis not present

## 2014-10-12 DIAGNOSIS — Z1151 Encounter for screening for human papillomavirus (HPV): Secondary | ICD-10-CM | POA: Diagnosis present

## 2014-10-12 DIAGNOSIS — N6019 Diffuse cystic mastopathy of unspecified breast: Secondary | ICD-10-CM

## 2014-10-12 DIAGNOSIS — Z124 Encounter for screening for malignant neoplasm of cervix: Secondary | ICD-10-CM

## 2014-10-12 NOTE — Progress Notes (Signed)
HPI 58 y.o.female presents for pap smear, she had her CPE in May. She had last PAP in 2013, has never had abnormal pap, last menses 5 years ago, she is sexually active, 1 partner, denies any symptoms.    Past Medical History  Diagnosis Date  . Anxiety   . Hyperlipidemia   . Unspecified vitamin D deficiency   . HSV-2 (herpes simplex virus 2) infection      No Known Allergies    Current Outpatient Prescriptions on File Prior to Visit  Medication Sig Dispense Refill  . aspirin 81 MG chewable tablet Chew 81 mg by mouth daily.    . calcium carbonate (OS-CAL) 600 MG TABS tablet Take 600 mg by mouth 2 (two) times daily with a meal.    . cholecalciferol (VITAMIN D) 1000 UNITS tablet Take 5,000 Units by mouth daily.     . citalopram (CELEXA) 40 MG tablet TAKE 1 TABLET BY MOUTH EVERY DAY FOR MOOD 90 tablet 1  . fish oil-omega-3 fatty acids 1000 MG capsule Take 1 g by mouth 2 (two) times daily.     Marland Kitchen ibuprofen (ADVIL,MOTRIN) 200 MG tablet Take 600 mg by mouth daily as needed for mild pain.    . Magnesium 250 MG TABS Take 250 mg by mouth daily.     . multivitamin-lutein (OCUVITE-LUTEIN) CAPS capsule Take 1 capsule by mouth daily.    . vitamin C (ASCORBIC ACID) 500 MG tablet Take 500 mg by mouth 2 (two) times daily.    . vitamin E 400 UNIT capsule Take 400 Units by mouth daily.     No current facility-administered medications on file prior to visit.    ROS: all negative except above.   Physical Exam: Filed Weights   10/12/14 1145  Weight: 140 lb (63.504 kg)   BP 102/64 mmHg  Pulse 60  Temp(Src) 98.3 F (36.8 C)  Resp 16  Ht 5\' 7"  (1.702 m)  Wt 140 lb (63.504 kg)  BMI 21.92 kg/m2 General Appearance: Well nourished, in no apparent distress..  Breasts: symmetric fibrous changes in both upper outer quadrants, left breast with indentation. YHC:WCBJSE external genitalia, vulva, vagina, cervix, uterus and adnexa, PAP: Pap smear done today. Abdomen: Soft, + BS.  Non tender, no guarding,  rebound, hernias, masses. Lymphatics: Non tender without lymphadenopathy.  Musculoskeletal: Full ROM, 5/5 strength, normal gait.  Skin: Warm, dry without rashes, lesions, ecchymosis.  Neuro: Cranial nerves intact. Normal muscle tone, no cerebellar symptoms.  Psych: Awake and oriented X 3, normal affect, Insight and Judgment appropriate.    Assessment and Plan: 1. Screening for cervical cancer - Cytology - PAP  2. Anxiety Continue meds PRN  3. Breast exam- + fibrocystic breast Get 3 D MGM     Vicie Mutters, PA-C 12:00 PM [facility]

## 2014-10-12 NOTE — Patient Instructions (Signed)
Pap Test A Pap test is a procedure done in a clinic office to evaluate cells that are on the surface of the cervix. The cervix is the lower portion of the uterus and upper portion of the vagina. For some women, the cervical region has the potential to form cancer. With consistent evaluations by your caregiver, this type of cancer can be prevented.  If a Pap test is abnormal, it is most often a result of a previous exposure to human papillomavirus (HPV). HPV is a virus that can infect the cells of the cervix and cause dysplasia. Dysplasia is where the cells no longer look normal. If a woman has been diagnosed with high-grade or severe dysplasia, they are at higher risk of developing cervical cancer. People diagnosed with low-grade dysplasia should still be seen by their caregiver because there is a small chance that low-grade dysplasia could develop into cancer.  LET YOUR CAREGIVER KNOW ABOUT:  Recent sexually transmitted infection (STI) you have had.  Any new sex partners you have had.  History of previous abnormal Pap tests results.  History of previous cervical procedures you have had (colposcopy, biopsy, loop electrosurgical excision procedure [LEEP]).  Concerns you have had regarding unusual vaginal discharge.  History of pelvic pain.  Your use of birth control. BEFORE THE PROCEDURE  Ask your caregiver when to schedule your Pap test. It is best not to be on your period if your caregiver uses a wooden spatula to collect cells or applies cells to a glass slide. Newer techniques are not so sensitive to the timing of a menstrual cycle.  Do not douche or have sexual intercourse for 24 hours before the test.   Do not use vaginal creams or tampons for 24 hours before the test.   Empty your bladder just before the test to lessen any discomfort.  PROCEDURE You will lie on an exam table with your feet in stirrups. A warm metal or plastic instrument (speculum) is placed in your vagina. This  instrument allows your caregiver to see the inside of your vagina and look at your cervix. A small, plastic brush or wooden spatula is then used to collect cervical cells. These cells are placed in a lab specimen container. The cells are looked at under a microscope. A specialist will determine if the cells are normal.  AFTER THE PROCEDURE Make sure to get your test results.If your results come back abnormal, you may need further testing.  Document Released: 01/11/2003 Document Revised: 01/13/2012 Document Reviewed: 10/17/2011 [employer] Patient Information 2015 [employer]. This information is not intended to replace advice given to you by your health care provider. Make sure you discuss any questions you have with your health care provider.  

## 2014-10-14 ENCOUNTER — Other Ambulatory Visit: Payer: Self-pay

## 2014-10-14 DIAGNOSIS — Z1231 Encounter for screening mammogram for malignant neoplasm of breast: Secondary | ICD-10-CM

## 2014-10-14 LAB — CYTOLOGY - PAP

## 2014-10-31 ENCOUNTER — Ambulatory Visit
Admission: RE | Admit: 2014-10-31 | Discharge: 2014-10-31 | Disposition: A | Discharge status: Home / Self Care | Payer: BC Managed Care – PPO | Source: Ambulatory Visit

## 2014-10-31 DIAGNOSIS — Z1231 Encounter for screening mammogram for malignant neoplasm of breast: Secondary | ICD-10-CM

## 2014-11-07 ENCOUNTER — Ambulatory Visit: Payer: BC Managed Care – PPO

## 2014-12-26 ENCOUNTER — Other Ambulatory Visit: Payer: Self-pay | Admitting: Emergency Medicine

## 2014-12-29 ENCOUNTER — Other Ambulatory Visit: Payer: Self-pay | Admitting: *Deleted

## 2014-12-29 DIAGNOSIS — I83893 Varicose veins of bilateral lower extremities with other complications: Secondary | ICD-10-CM

## 2015-02-06 ENCOUNTER — Encounter: Payer: Self-pay | Admitting: Vascular Surgery

## 2015-02-07 ENCOUNTER — Encounter: Payer: Self-pay | Admitting: Vascular Surgery

## 2015-02-07 ENCOUNTER — Ambulatory Visit (HOSPITAL_COMMUNITY)
Admission: RE | Admit: 2015-02-07 | Discharge: 2015-02-07 | Disposition: A | Discharge status: Home / Self Care | Payer: BLUE CROSS/BLUE SHIELD | Source: Ambulatory Visit | Attending: Vascular Surgery | Admitting: Vascular Surgery

## 2015-02-07 ENCOUNTER — Ambulatory Visit (INDEPENDENT_AMBULATORY_CARE_PROVIDER_SITE_OTHER): Payer: BLUE CROSS/BLUE SHIELD | Admitting: Vascular Surgery

## 2015-02-07 VITALS — BP 109/70 | HR 59 | Resp 18 | Ht 66.25 in | Wt 133.6 lb

## 2015-02-07 DIAGNOSIS — I83893 Varicose veins of bilateral lower extremities with other complications: Secondary | ICD-10-CM | POA: Diagnosis not present

## 2015-02-07 DIAGNOSIS — I789 Disease of capillaries, unspecified: Secondary | ICD-10-CM | POA: Diagnosis not present

## 2015-02-07 DIAGNOSIS — I781 Nevus, non-neoplastic: Secondary | ICD-10-CM

## 2015-02-07 NOTE — Progress Notes (Addendum)
[facility] [place]   Reason for referral: Swollen bilateral leg with engorged veins  History of Present Illness  Lauren Fry is a 59 y.o. female who presents with chief complaint: swollen leg.  Patient notes, onset of swelling 5-6 years ago, associated with prolonged sitting and standing.  Shehas had progressive changes of spider telangiectasia mostprominently over her distal calves on to her ankles and feet. The patient has had no history of DVT, positive history of varicose vein, no history of venous stasis ulcers, no history of  Lymphedema and no history of skin changes in lower legs.  There is  family history of venous disorders.  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   in the past.  Past medical history: Hyperlipidemia.  She denise hypertension and DM.  She takes a fish oil supplement as well as an 81 mg Asprin daily.  Past Medical History  Diagnosis Date  . Anxiety   . Hyperlipidemia   . Unspecified vitamin D deficiency   . HSV-2 (herpes simplex virus 2) infection   . Varicose veins     Past Surgical History  Procedure Laterality Date  . Lasik  1990  . Lasik  1990    History   Social History  . Marital Status: Married    Spouse Name: N/A  . Number of Children: N/A  . Years of Education: N/A   Occupational History  . Not on file.   Social History Main Topics  . Smoking status: Former Smoker    Quit date: 11/04/1982  . Smokeless tobacco: Not on file  . Alcohol Use: 2.4 oz/week    4 Glasses of wine per week  . Drug Use: No  . Sexual Activity: Yes    Birth Control/ Protection: None   Other Topics Concern  . Not on file   Social History Narrative    Family History  Problem Relation Age of Onset  . Cancer Mother 74    lung  . Dementia Father   . Cancer Sister 63    colon  . Cancer Brother 90    colon    Current Outpatient Prescriptions on File Prior to Visit    Medication Sig Dispense Refill  . aspirin 81 MG chewable tablet Chew 81 mg by mouth daily.    . calcium carbonate (OS-CAL) 600 MG TABS tablet Take 600 mg by mouth 2 (two) times daily with a meal.    . cholecalciferol (VITAMIN D) 1000 UNITS tablet Take 5,000 Units by mouth daily.     . citalopram (CELEXA) 40 MG tablet TAKE 1 TABLET BY MOUTH EVERY DAY FOR MOOD 90 tablet 1  . famciclovir (FAMVIR) 500 MG tablet TAKE 1 TABLET BY MOUTH EVERY DAY 90 tablet 3  . fish oil-omega-3 fatty acids 1000 MG capsule Take 1 g by mouth 2 (two) times daily.     . Magnesium 250 MG TABS Take 250 mg by mouth daily.     . multivitamin-lutein (OCUVITE-LUTEIN) CAPS capsule Take 1 capsule by mouth daily.    . vitamin C (ASCORBIC ACID) 500 MG tablet Take 500 mg by mouth 2 (two) times daily.    . vitamin E 400 UNIT capsule Take 400 Units by mouth daily.    Marland Kitchen ibuprofen (ADVIL,MOTRIN) 200 MG tablet Take 600 mg by mouth daily as needed for mild pain.     No current facility-administered medications on file prior to visit.  Allergies as of 02/07/2015  . (No Known Allergies)     ROS:   General:  No weight loss, Fever, chills  HEENT: No recent headaches, no nasal bleeding, no visual changes, no sore throat  Neurologic: No dizziness, blackouts, seizures. No recent symptoms of stroke or mini- stroke. No recent episodes of slurred speech, or temporary blindness.  Cardiac: No recent episodes of chest pain/pressure, no shortness of breath at rest.  No shortness of breath with exertion.  Denies history of atrial fibrillation or irregular heartbeat  Vascular: No history of rest pain in feet.  No history of claudication.  No history of non-healing ulcer, No history of DVT   Pulmonary: No home oxygen, no productive cough, no hemoptysis,  No asthma or wheezing  Musculoskeletal:  [ ]  Arthritis, [ ]  Low back pain,  [ ]  Joint pain  Hematologic:No history of hypercoagulable state.  No history of easy bleeding.  No history of  anemia  Gastrointestinal: No hematochezia or melena,  No gastroesophageal reflux, no trouble swallowing  Urinary: [ ]  chronic Kidney disease, [ ]  on HD - [ ]  MWF or [ ]  TTHS, [ ]  Burning with urination, [ ]  Frequent urination, [ ]  Difficulty urinating;   Skin: No rashes  Psychological: No history of anxiety,  No history of depression  Physical Examination  Filed Vitals:   02/07/15 1416  BP: 109/70  Pulse: 59  Resp: 18  Height: 5' 6.25" (1.683 m)  Weight: 133 lb 9.6 oz (60.601 kg)    Body mass index is 21.39 kg/(m^2).  General:  Alert and oriented, no acute distress HEENT: Normal Neck: No bruit or JVD Pulmonary: Clear to auscultation bilaterally Cardiac: Regular Rate and Rhythm without murmur Abdomen: Soft, non-tender, non-distended, no mass, no scars Skin: No rash, prominent telangiectasia anterior shins and both medial and lateral ankles.  Small spider telangiectasia on the medial thighs.  Extremity Pulses:  2+ radial, brachial, femoral, dorsalis pedis, posterior tibial pulses bilaterally Musculoskeletal: No deformity or edema  Neurologic: Upper and lower extremity motor 5/5 and symmetric  DATA: Venous duplex  Negative for DVT and without superficial venous reflux.  No evidence of venous hypertension. She does have mild deep venous reflux.  Assessment:  prominent telangiectasia bilateral LE without venous hypertensions or superficial venous reflux. Plan: She would benefit from sclerotherapy and would certainly give an improved cosmetic result. This can be performed on an out patient basis.  She has an appointment to see Thea Silversmith RN next week.  The patient was sen in conjunction with Dr. Tonie Griffith, [facility] PA-C [facility] [place] Office: [phone]    I have examined the patient, reviewed and agree with above.  EARLY, TODD, MD 02/07/2015 4:34 PM

## 2015-02-14 ENCOUNTER — Encounter: Payer: Self-pay | Admitting: *Deleted

## 2015-02-15 ENCOUNTER — Ambulatory Visit: Payer: Self-pay

## 2015-02-15 ENCOUNTER — Ambulatory Visit (INDEPENDENT_AMBULATORY_CARE_PROVIDER_SITE_OTHER): Payer: BLUE CROSS/BLUE SHIELD | Admitting: *Deleted

## 2015-02-15 DIAGNOSIS — I8393 Asymptomatic varicose veins of bilateral lower extremities: Secondary | ICD-10-CM

## 2015-02-15 DIAGNOSIS — I83893 Varicose veins of bilateral lower extremities with other complications: Secondary | ICD-10-CM

## 2015-02-15 NOTE — Progress Notes (Signed)
X=.3% Sotradecol administered with a 27g butterfly.  Patient received a total of 18cc foam with CO2..  Pt has extensive reticular and spider veins on both legs scattered throughout. Her reflux study shower some deep reflux but no reflux in her superficial systems so sclero is the best form of treatment to eliminate her painful veins.  Easy access and tolerated well. Anticipate good results but she may want more sessions to get 100%. Will follow this nice lady prn.  Photos: No.  Compression stockings applied: Yes.

## 2015-03-13 ENCOUNTER — Ambulatory Visit (INDEPENDENT_AMBULATORY_CARE_PROVIDER_SITE_OTHER): Payer: BLUE CROSS/BLUE SHIELD | Admitting: Internal Medicine

## 2015-03-13 ENCOUNTER — Encounter: Payer: Self-pay | Admitting: Internal Medicine

## 2015-03-13 VITALS — BP 110/62 | HR 68 | Temp 97.3°F | Resp 16 | Ht 67.0 in | Wt 136.0 lb

## 2015-03-13 DIAGNOSIS — Z111 Encounter for screening for respiratory tuberculosis: Secondary | ICD-10-CM

## 2015-03-13 DIAGNOSIS — E559 Vitamin D deficiency, unspecified: Secondary | ICD-10-CM

## 2015-03-13 DIAGNOSIS — Z1212 Encounter for screening for malignant neoplasm of rectum: Secondary | ICD-10-CM

## 2015-03-13 DIAGNOSIS — IMO0001 Reserved for inherently not codable concepts without codable children: Secondary | ICD-10-CM

## 2015-03-13 DIAGNOSIS — R030 Elevated blood-pressure reading, without diagnosis of hypertension: Secondary | ICD-10-CM

## 2015-03-13 DIAGNOSIS — R5383 Other fatigue: Secondary | ICD-10-CM

## 2015-03-13 DIAGNOSIS — Z79899 Other long term (current) drug therapy: Secondary | ICD-10-CM

## 2015-03-13 DIAGNOSIS — R7309 Other abnormal glucose: Secondary | ICD-10-CM

## 2015-03-13 DIAGNOSIS — E785 Hyperlipidemia, unspecified: Secondary | ICD-10-CM

## 2015-03-13 LAB — HEMOGLOBIN A1C
Hgb A1c MFr Bld: 5.6 % (ref <5.7)
Mean Plasma Glucose: 114 mg/dL (ref <117)

## 2015-03-13 LAB — CBC WITH DIFFERENTIAL/PLATELET
Basophils Absolute: 0 10*3/uL (ref 0.0–0.1)
Basophils Relative: 0 % (ref 0–1)
EOS ABS: 0.2 10*3/uL (ref 0.0–0.7)
Eosinophils Relative: 3 % (ref 0–5)
HCT: 39.7 % (ref 36.0–46.0)
HEMOGLOBIN: 13.2 g/dL (ref 12.0–15.0)
LYMPHS ABS: 1.5 10*3/uL (ref 0.7–4.0)
Lymphocytes Relative: 29 % (ref 12–46)
MCH: 31.3 pg (ref 26.0–34.0)
MCHC: 33.2 g/dL (ref 30.0–36.0)
MCV: 94.1 fL (ref 78.0–100.0)
MPV: 9.1 fL (ref 8.6–12.4)
Monocytes Absolute: 0.5 10*3/uL (ref 0.1–1.0)
Monocytes Relative: 9 % (ref 3–12)
Neutro Abs: 3 10*3/uL (ref 1.7–7.7)
Neutrophils Relative %: 59 % (ref 43–77)
PLATELETS: 209 10*3/uL (ref 150–400)
RBC: 4.22 MIL/uL (ref 3.87–5.11)
RDW: 13.8 % (ref 11.5–15.5)
WBC: 5.1 10*3/uL (ref 4.0–10.5)

## 2015-03-13 LAB — LIPID PANEL
CHOL/HDL RATIO: 3.2 ratio
CHOLESTEROL: 188 mg/dL (ref 0–200)
HDL: 59 mg/dL (ref >=46)
LDL Cholesterol: 108 mg/dL — ABNORMAL HIGH (ref 0–99)
Triglycerides: 106 mg/dL (ref <150)
VLDL: 21 mg/dL (ref 0–40)

## 2015-03-13 LAB — MAGNESIUM: Magnesium: 2.2 mg/dL (ref 1.5–2.5)

## 2015-03-13 LAB — BASIC METABOLIC PANEL WITH GFR
BUN: 24 mg/dL — AB (ref 6–23)
CO2: 29 mEq/L (ref 19–32)
CREATININE: 0.91 mg/dL (ref 0.50–1.10)
Calcium: 9.4 mg/dL (ref 8.4–10.5)
Chloride: 104 mEq/L (ref 96–112)
GFR, EST NON AFRICAN AMERICAN: 69 mL/min
GFR, Est African American: 79 mL/min
Glucose, Bld: 66 mg/dL — ABNORMAL LOW (ref 70–99)
Potassium: 4.6 mEq/L (ref 3.5–5.3)
Sodium: 140 mEq/L (ref 135–145)

## 2015-03-13 LAB — IRON AND TIBC
%SAT: 23 % (ref 20–55)
IRON: 78 ug/dL (ref 42–145)
TIBC: 335 ug/dL (ref 250–470)
UIBC: 257 ug/dL (ref 125–400)

## 2015-03-13 LAB — HEPATIC FUNCTION PANEL
ALT: 15 U/L (ref 0–35)
AST: 22 U/L (ref 0–37)
Albumin: 4.1 g/dL (ref 3.5–5.2)
Alkaline Phosphatase: 55 U/L (ref 39–117)
BILIRUBIN INDIRECT: 0.4 mg/dL (ref 0.2–1.2)
BILIRUBIN TOTAL: 0.5 mg/dL (ref 0.2–1.2)
Bilirubin, Direct: 0.1 mg/dL (ref 0.0–0.3)
Total Protein: 6.5 g/dL (ref 6.0–8.3)

## 2015-03-13 LAB — VITAMIN B12: VITAMIN B 12: 837 pg/mL (ref 211–911)

## 2015-03-13 LAB — TSH: TSH: 1.81 u[IU]/mL (ref 0.350–4.500)

## 2015-03-13 MED ORDER — SCOPOLAMINE 1 MG/3DAYS TD PT72: MEDICATED_PATCH | TRANSDERMAL | Status: DC

## 2015-03-13 MED ORDER — MILK THISTLE 250 MG PO CAPS: 1.0000 | ORAL_CAPSULE | Freq: Every day | ORAL | Status: AC

## 2015-03-13 NOTE — Patient Instructions (Signed)
Recommend Low dose or baby Aspirin 81 mg daily   To reduce risk of Colon Cancer 20 %, Skin Cancer 26 % , Melanoma 46% and   Pancreatic cancer 60%  ++++++++++++++++++ Vitamin D goal is between 70-100.   Please make sure that you are taking your Vitamin D as directed.   It is very important as a natural antiinflammatory   helping hair, skin, and nails, as well as reducing stroke and heart attack risk.   It helps your bones and helps with mood.  It also decreases numerous cancer risks so please take it as directed.   Low Vit D is associated with a 200-300% higher risk for CANCER   and 200-300% higher risk for HEART   ATTACK  &  STROKE.    ......................................  It is also associated with higher death rate at younger ages,   autoimmune diseases like Rheumatoid arthritis, Lupus, Multiple Sclerosis.     Also many other serious conditions, like depression, Alzheimer's  Dementia, infertility, muscle aches, fatigue, fibromyalgia - just to name a few.  +++++++++++++++++++    Recommend the book "The END of DIETING" by Dr Joel Fuhrman   & the book "The END of DIABETES " by Dr Joel Fuhrman  At [employer] - get book & Audio CD's     Being diabetic has a  300% increased risk for heart attack, stroke, cancer, and alzheimer- type vascular dementia. It is very important that you work harder with diet by avoiding all foods that are white. Avoid white rice (brown & wild rice is OK), white potatoes (sweetpotatoes in moderation is OK), White bread or wheat bread or anything made out of white flour like bagels, donuts, rolls, buns, biscuits, cakes, pastries, cookies, pizza crust, and pasta (made from white flour & egg whites) - vegetarian pasta or spinach or wheat pasta is OK. Multigrain breads like Arnold's or [employer], or multigrain sandwich thins or flatbreads.  Diet, exercise and weight loss can reverse and cure diabetes in the early stages.  Diet, exercise and weight  loss is very important in the control and prevention of complications of diabetes which affects every system in your body, ie. Brain - dementia/stroke, eyes - glaucoma/blindness, heart - heart attack/heart failure, kidneys - dialysis, stomach - gastric paralysis, intestines - malabsorption, nerves - severe painful neuritis, circulation - gangrene & loss of a leg(s), and finally cancer and Alzheimers.    I recommend avoid fried & greasy foods,  sweets/candy, white rice (brown or wild rice or Quinoa is OK), white potatoes (sweet potatoes are OK) - anything made from white flour - bagels, doughnuts, rolls, buns, biscuits,white and wheat breads, pizza crust and traditional pasta made of white flour & egg white(vegetarian pasta or spinach or wheat pasta is OK).  Multi-grain bread is OK - like multi-grain flat bread or sandwich thins. Avoid alcohol in excess. Exercise is also important.    Eat all the vegetables you want - avoid meat, especially red meat and dairy - especially cheese.  Cheese is the most concentrated form of trans-fats which is the worst thing to clog up our arteries. Veggie cheese is OK which can be found in the fresh produce section at [employer] or [employer] or [employer]  ++++++++++++++++++++++++++  Preventive Care for Adults  A healthy lifestyle and preventive care can promote health and wellness. Preventive health guidelines for women include the following key practices.  A routine yearly physical is a good way to check with your health care   provider about your health and preventive screening. It is a chance to share any concerns and updates on your health and to receive a thorough exam.  Visit your dentist for a routine exam and preventive care every 6 months. Brush your teeth twice a day and floss once a day. Good oral hygiene prevents tooth decay and gum disease.  The frequency of eye exams is based on your age, health, family medical history, use of contact lenses, and other  factors. Follow your health care provider's recommendations for frequency of eye exams.  Eat a healthy diet. Foods like vegetables, fruits, whole grains, low-fat dairy products, and lean protein foods contain the nutrients you need without too many calories. Decrease your intake of foods high in solid fats, added sugars, and salt. Eat the right amount of calories for you.Get information about a proper diet from your health care provider, if necessary.  Regular physical exercise is one of the most important things you can do for your health. Most adults should get at least 150 minutes of moderate-intensity exercise (any activity that increases your heart rate and causes you to sweat) each week. In addition, most adults need muscle-strengthening exercises on 2 or more days a week.  Maintain a healthy weight. The body mass index (BMI) is a screening tool to identify possible weight problems. It provides an estimate of body fat based on height and weight. Your health care provider can find your BMI and can help you achieve or maintain a healthy weight.For adults 20 years and older:  A BMI below 18.5 is considered underweight.  A BMI of 18.5 to 24.9 is normal.  A BMI of 25 to 29.9 is considered overweight.  A BMI of 30 and above is considered obese.  Maintain normal blood lipids and cholesterol levels by exercising and minimizing your intake of saturated fat. Eat a balanced diet with plenty of fruit and vegetables. Blood tests for lipids and cholesterol should begin at age 20 and be repeated every 5 years. If your lipid or cholesterol levels are high, you are over 50, or you are at high risk for heart disease, you may need your cholesterol levels checked more frequently.Ongoing high lipid and cholesterol levels should be treated with medicines if diet and exercise are not working.  If you smoke, find out from your health care provider how to quit. If you do not use tobacco, do not start.  Lung  cancer screening is recommended for adults aged 55-80 years who are at high risk for developing lung cancer because of a history of smoking. A yearly low-dose CT scan of the lungs is recommended for people who have at least a 30-pack-year history of smoking and are a current smoker or have quit within the past 15 years. A pack year of smoking is smoking an average of 1 pack of cigarettes a day for 1 year (for example: 1 pack a day for 30 years or 2 packs a day for 15 years). Yearly screening should continue until the smoker has stopped smoking for at least 15 years. Yearly screening should be stopped for people who develop a health problem that would prevent them from having lung cancer treatment.  High blood pressure causes heart disease and increases the risk of stroke. Your blood pressure should be checked at least every 1 to 2 years. Ongoing high blood pressure should be treated with medicines if weight loss and exercise do not work.  If you are 55-79 years old, ask your   health care provider if you should take aspirin to prevent strokes.  Diabetes screening involves taking a blood sample to check your fasting blood sugar level. This should be done once every 3 years, after age 45, if you are within normal weight and without risk factors for diabetes. Testing should be considered at a younger age or be carried out more frequently if you are overweight and have at least 1 risk factor for diabetes.  Breast cancer screening is essential preventive care for women. You should practice "breast self-awareness." This means understanding the normal appearance and feel of your breasts and may include breast self-examination. Any changes detected, no matter how small, should be reported to a health care provider. Women in their 20s and 30s should have a clinical breast exam (CBE) by a health care provider as part of a regular health exam every 1 to 3 years. After age 40, women should have a CBE every year. Starting  at age 40, women should consider having a mammogram (breast X-ray test) every year. Women who have a family history of breast cancer should talk to their health care provider about genetic screening. Women at a high risk of breast cancer should talk to their health care providers about having an MRI and a mammogram every year.  Breast cancer gene (BRCA)-related cancer risk assessment is recommended for women who have family members with BRCA-related cancers. BRCA-related cancers include breast, ovarian, tubal, and peritoneal cancers. Having family members with these cancers may be associated with an increased risk for harmful changes (mutations) in the breast cancer genes BRCA1 and BRCA2. Results of the assessment will determine the need for genetic counseling and BRCA1 and BRCA2 testing.  Routine pelvic exams to screen for cancer are no longer recommended for nonpregnant women who are considered low risk for cancer of the pelvic organs (ovaries, uterus, and vagina) and who do not have symptoms. Ask your health care provider if a screening pelvic exam is right for you.  If you have had past treatment for cervical cancer or a condition that could lead to cancer, you need Pap tests and screening for cancer for at least 20 years after your treatment. If Pap tests have been discontinued, your risk factors (such as having a new sexual partner) need to be reassessed to determine if screening should be resumed. Some women have medical problems that increase the chance of getting cervical cancer. In these cases, your health care provider may recommend more frequent screening and Pap tests.  Colorectal cancer can be detected and often prevented. Most routine colorectal cancer screening begins at the age of 50 years and continues through age 75 years. However, your health care provider may recommend screening at an earlier age if you have risk factors for colon cancer. On a yearly basis, your health care provider may  provide home test kits to check for hidden blood in the stool. Use of a small camera at the end of a tube, to directly examine the colon (sigmoidoscopy or colonoscopy), can detect the earliest forms of colorectal cancer. Talk to your health care provider about this at age 50, when routine screening begins. Direct exam of the colon should be repeated every 5-10 years through age 75 years, unless early forms of pre-cancerous polyps or small growths are found.  Hepatitis C blood testing is recommended for all people born from 1945 through 1965 and any individual with known risks for hepatitis C.  Pra  Osteoporosis is a disease in which the   bones lose minerals and strength with aging. This can result in serious bone fractures or breaks. The risk of osteoporosis can be identified using a bone density scan. Women ages 65 years and over and women at risk for fractures or osteoporosis should discuss screening with their health care providers. Ask your health care provider whether you should take a calcium supplement or vitamin D to reduce the rate of osteoporosis.  Menopause can be associated with physical symptoms and risks. Hormone replacement therapy is available to decrease symptoms and risks. You should talk to your health care provider about whether hormone replacement therapy is right for you.  Use sunscreen. Apply sunscreen liberally and repeatedly throughout the day. You should seek shade when your shadow is shorter than you. Protect yourself by wearing long sleeves, pants, a wide-brimmed hat, and sunglasses year round, whenever you are outdoors.  Once a month, do a whole body skin exam, using a mirror to look at the skin on your back. Tell your health care provider of new moles, moles that have irregular borders, moles that are larger than a pencil eraser, or moles that have changed in shape or color.  Stay current with required vaccines (immunizations).  Influenza vaccine. All adults should be  immunized every year.  Tetanus, diphtheria, and acellular pertussis (Td, Tdap) vaccine. Pregnant women should receive 1 dose of Tdap vaccine during each pregnancy. The dose should be obtained regardless of the length of time since the last dose. Immunization is preferred during the 27th-36th week of gestation. An adult who has not previously received Tdap or who does not know her vaccine status should receive 1 dose of Tdap. This initial dose should be followed by tetanus and diphtheria toxoids (Td) booster doses every 10 years. Adults with an unknown or incomplete history of completing a 3-dose immunization series with Td-containing vaccines should begin or complete a primary immunization series including a Tdap dose. Adults should receive a Td booster every 10 years.  Varicella vaccine. An adult without evidence of immunity to varicella should receive 2 doses or a second dose if she has previously received 1 dose. Pregnant females who do not have evidence of immunity should receive the first dose after pregnancy. This first dose should be obtained before leaving the health care facility. The second dose should be obtained 4-8 weeks after the first dose.  Human papillomavirus (HPV) vaccine. Females aged 13-26 years who have not received the vaccine previously should obtain the 3-dose series. The vaccine is not recommended for use in pregnant females. However, pregnancy testing is not needed before receiving a dose. If a female is found to be pregnant after receiving a dose, no treatment is needed. In that case, the remaining doses should be delayed until after the pregnancy. Immunization is recommended for any person with an immunocompromised condition through the age of 26 years if she did not get any or all doses earlier. During the 3-dose series, the second dose should be obtained 4-8 weeks after the first dose. The third dose should be obtained 24 weeks after the first dose and 16 weeks after the second  dose.  Zoster vaccine. One dose is recommended for adults aged 60 years or older unless certain conditions are present.  Measles, mumps, and rubella (MMR) vaccine. Adults born before 1957 generally are considered immune to measles and mumps. Adults born in 1957 or later should have 1 or more doses of MMR vaccine unless there is a contraindication to the vaccine or there is   laboratory evidence of immunity to each of the three diseases. A routine second dose of MMR vaccine should be obtained at least 28 days after the first dose for students attending postsecondary schools, health care workers, or international travelers. People who received inactivated measles vaccine or an unknown type of measles vaccine during 1963-1967 should receive 2 doses of MMR vaccine. People who received inactivated mumps vaccine or an unknown type of mumps vaccine before 1979 and are at high risk for mumps infection should consider immunization with 2 doses of MMR vaccine. For females of childbearing age, rubella immunity should be determined. If there is no evidence of immunity, females who are not pregnant should be vaccinated. If there is no evidence of immunity, females who are pregnant should delay immunization until after pregnancy. Unvaccinated health care workers born before 1957 who lack laboratory evidence of measles, mumps, or rubella immunity or laboratory confirmation of disease should consider measles and mumps immunization with 2 doses of MMR vaccine or rubella immunization with 1 dose of MMR vaccine.  Pneumococcal 13-valent conjugate (PCV13) vaccine. When indicated, a person who is uncertain of her immunization history and has no record of immunization should receive the PCV13 vaccine. An adult aged 19 years or older who has certain medical conditions and has not been previously immunized should receive 1 dose of PCV13 vaccine. This PCV13 should be followed with a dose of pneumococcal polysaccharide (PPSV23) vaccine.  The PPSV23 vaccine dose should be obtained at least 8 weeks after the dose of PCV13 vaccine. An adult aged 19 years or older who has certain medical conditions and previously received 1 or more doses of PPSV23 vaccine should receive 1 dose of PCV13. The PCV13 vaccine dose should be obtained 1 or more years after the last PPSV23 vaccine dose.    Pneumococcal polysaccharide (PPSV23) vaccine. When PCV13 is also indicated, PCV13 should be obtained first. All adults aged 65 years and older should be immunized. An adult younger than age 65 years who has certain medical conditions should be immunized. Any person who resides in a nursing home or long-term care facility should be immunized. An adult smoker should be immunized. People with an immunocompromised condition and certain other conditions should receive both PCV13 and PPSV23 vaccines. People with human immunodeficiency virus (HIV) infection should be immunized as soon as possible after diagnosis. Immunization during chemotherapy or radiation therapy should be avoided. Routine use of PPSV23 vaccine is not recommended for American Indians, [place] Natives, or people younger than 65 years unless there are medical conditions that require PPSV23 vaccine. When indicated, people who have unknown immunization and have no record of immunization should receive PPSV23 vaccine. One-time revaccination 5 years after the first dose of PPSV23 is recommended for people aged 19-64 years who have chronic kidney failure, nephrotic syndrome, asplenia, or immunocompromised conditions. People who received 1-2 doses of PPSV23 before age 65 years should receive another dose of PPSV23 vaccine at age 65 years or later if at least 5 years have passed since the previous dose. Doses of PPSV23 are not needed for people immunized with PPSV23 at or after age 65 years.  Preventive Services / Frequency   Ages 40 to 64 years  Blood pressure check.  Lipid and cholesterol check.  Lung  cancer screening. / Every year if you are aged 55-80 years and have a 30-pack-year history of smoking and currently smoke or have quit within the past 15 years. Yearly screening is stopped once you have quit smoking for at   least 15 years or develop a health problem that would prevent you from having lung cancer treatment.  Clinical breast exam.** / Every year after age 40 years.  BRCA-related cancer risk assessment.** / For women who have family members with a BRCA-related cancer (breast, ovarian, tubal, or peritoneal cancers).  Mammogram.** / Every year beginning at age 40 years and continuing for as long as you are in good health. Consult with your health care provider.  Pap test.** / Every 3 years starting at age 30 years through age 65 or 70 years with a history of 3 consecutive normal Pap tests.  HPV screening.** / Every 3 years from ages 30 years through ages 65 to 70 years with a history of 3 consecutive normal Pap tests.  Fecal occult blood test (FOBT) of stool. / Every year beginning at age 50 years and continuing until age 75 years. You may not need to do this test if you get a colonoscopy every 10 years.  Flexible sigmoidoscopy or colonoscopy.** / Every 5 years for a flexible sigmoidoscopy or every 10 years for a colonoscopy beginning at age 50 years and continuing until age 75 years.  Hepatitis C blood test.** / For all people born from 1945 through 1965 and any individual with known risks for hepatitis C.  Skin self-exam. / Monthly.  Influenza vaccine. / Every year.  Tetanus, diphtheria, and acellular pertussis (Tdap/Td) vaccine.** / Consult your health care provider. Pregnant women should receive 1 dose of Tdap vaccine during each pregnancy. 1 dose of Td every 10 years.  Varicella vaccine.** / Consult your health care provider. Pregnant females who do not have evidence of immunity should receive the first dose after pregnancy.  Zoster vaccine.** / 1 dose for adults aged 60  years or older.  Pneumococcal 13-valent conjugate (PCV13) vaccine.** / Consult your health care provider.  Pneumococcal polysaccharide (PPSV23) vaccine.** / 1 to 2 doses if you smoke cigarettes or if you have certain conditions.  Meningococcal vaccine.** / Consult your health care provider.  Hepatitis A vaccine.** / Consult your health care provider.  Hepatitis B vaccine.** / Consult your health care provider. Screening for abdominal aortic aneurysm (AAA)  by ultrasound is recommended for people over 50 who have history of high blood pressure or who are current or former smokers. 

## 2015-03-13 NOTE — Progress Notes (Signed)
Patient ID: Lauren Fry, female   DOB: 16-Nov-1955, 59 y.o.   MRN: [id number]  Annual Comprehensive Examination  This very nice 59 y.o. DWF presents for complete physical.  Patient has been followed for elevated  BP, Cholesterol, Glucose and Vitamin D Deficiency. 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    Patient's BP has been controlled at home and patient denies any cardiac symptoms as chest pain, palpitations, shortness of breath, dizziness or ankle swelling. Today's BP: 110/62 mmHg    Patient's hyperlipidemia is controlled with diet and medications. Patient denies myalgias or other medication SE's. Last lipids were Total Chol 191, HDL 70 , sl elevated LDL 102 and Trig 96.    Patient is screened for abnormal glucose and patient denies reactive hypoglycemic symptoms, visual blurring, diabetic polys, or paresthesias. Last A1c was 5.1% on 09 Mar 2014.     Finally, patient has history of Vitamin D Deficiency and last Vitamin D was 42 on 09 Mar 2014.      Medication Sig  . aspirin 81 MG chewable tablet Chew 81 mg by mouth daily.  . calcium carbonate (OS-CAL) 600 MG TABS tablet Take 600 mg by mouth 2 (two) times daily with a meal.  . cholecalciferol (VITAMIN D) 1000 UNITS tablet Take 5,000 Units by mouth daily.   . citalopram (CELEXA) 40 MG tablet TAKE 1 TABLET BY MOUTH EVERY DAY FOR MOOD  . famciclovir (FAMVIR) 500 MG tablet TAKE 1 TABLET BY MOUTH EVERY DAY  . fish oil-omega-3 fatty acids 1000 MG capsule Take 1 g by mouth 2 (two) times daily.   Marland Kitchen ibuprofen (ADVIL,MOTRIN) 200 MG tablet Take 600 mg by mouth daily as needed for mild pain.  . Magnesium 250 MG TABS Take 250 mg by mouth daily.   . multivitamin-lutein (OCUVITE-LUTEIN) CAPS capsule Take 1 capsule by mouth daily.  . vitamin C (ASCORBIC ACID) 500 MG tablet Take 500 mg by mouth 2 (two) times daily.  . vitamin E 400 UNIT capsule Take 400 Units by mouth daily.   No Known Allergies    Past Medical History  Diagnosis Date  . Anxiety   . Hyperlipidemia   . Unspecified vitamin D deficiency   . HSV-2 (herpes simplex virus 2) infection   . Varicose veins    Health Maintenance  Topic Date Due  . INFLUENZA VACCINE  06/05/2015  . MAMMOGRAM  10/31/2016  . PAP SMEAR  10/12/2017  . COLONOSCOPY  01/29/2023  . TETANUS/TDAP  03/09/2024  . ZOSTAVAX  Completed  . HIV Screening  Completed   Immunization History  Administered Date(s) Administered  . PPD Test 03/09/2014, 03/13/2015  . Tdap 03/09/2014  . Zoster 08/26/2014   Past Surgical History  Procedure Laterality Date  . Lasik  1990  . Lasik  1990   Family History  Problem Relation Age of Onset  . Cancer Mother 43    lung  . Dementia Father   . Cancer Sister 52    colon  . Cancer Brother 54    colon   History  Substance Use Topics  . Smoking status: Former Smoker    Quit date: 11/04/1982  . Smokeless tobacco: Not on file  . Alcohol Use: 2.4 oz/week    4 Glasses of wine per week    ROS Constitutional: Denies fever, chills, weight loss/gain, headaches, insomnia,  night sweats, and change in appetite. Does c/o fatigue. Eyes: Denies redness, blurred vision, diplopia, discharge,  itchy, watery eyes.  ENT: Denies discharge, congestion, post nasal drip, epistaxis, sore throat, earache, hearing loss, dental pain, Tinnitus, Vertigo, Sinus pain, snoring.  Cardio: Denies chest pain, palpitations, irregular heartbeat, syncope, dyspnea, diaphoresis, orthopnea, PND, claudication, edema Respiratory: denies cough, dyspnea, DOE, pleurisy, hoarseness, laryngitis, wheezing.  Gastrointestinal: Denies dysphagia, heartburn, reflux, water brash, pain, cramps, nausea, vomiting, bloating, diarrhea, constipation, hematemesis, melena, hematochezia, jaundice, hemorrhoids Genitourinary: Denies dysuria, frequency, urgency, nocturia, hesitancy, discharge, hematuria, flank pain Breast: Breast lumps, nipple discharge, bleeding.   Musculoskeletal: Denies arthralgia, myalgia, stiffness, Jt. Swelling, pain, limp, and strain/sprain. Denies falls. Skin: Denies puritis, rash, hives, warts, acne, eczema, changing in skin lesion Neuro: No weakness, tremor, incoordination, spasms, paresthesia, pain Psychiatric: Denies confusion, memory loss, sensory loss. Denies Depression. Endocrine: Denies change in weight, skin, hair change, nocturia, and paresthesia, diabetic polys, visual blurring, hyper / hypo glycemic episodes.  Heme/Lymph: No excessive bleeding, bruising, enlarged lymph nodes.  Physical Exam  BP 110/62 mmHg  Pulse 68  Temp(Src) 97.3 F (36.3 C)  Resp 16  Ht 5\' 7"  (1.702 m)  Wt 136 lb (61.689 kg)  BMI 21.30 kg/m2  General Appearance: Well nourished and in no apparent distress. Eyes: PERRLA, EOMs, conjunctiva no swelling or erythema, normal fundi and vessels. Sinuses: No frontal/maxillary tenderness ENT/Mouth: EACs patent / TMs  nl. Nares clear without erythema, swelling, mucoid exudates. Oral hygiene is good. No erythema, swelling, or exudate. Tongue normal, non-obstructing. Tonsils not swollen or erythematous. Hearing normal.  Neck: Supple, thyroid normal. No bruits, nodes or JVD. Respiratory: Respiratory effort normal.  BS equal and clear bilateral without rales, rhonci, wheezing or stridor. Cardio: Heart sounds are normal with regular rate and rhythm and no murmurs, rubs or gallops. Peripheral pulses are normal and equal bilaterally without edema. No aortic or femoral bruits. Chest: symmetric with normal excursions and percussion. Breasts: Deferred - had recent neg MGM & breast exam. Abdomen: Flat, soft, with bowl sounds. Nontender, no guarding, rebound, hernias, masses, or organomegaly.  Lymphatics: Non tender without lymphadenopathy.  Genitourinary: Pap - done 6 mo ago.  Musculoskeletal: Full ROM all peripheral extremities, joint stability, 5/5 strength, and normal gait. Skin: Warm and dry without rashes,  lesions, cyanosis, clubbing or  ecchymosis.  Neuro: Cranial nerves intact, reflexes equal bilaterally. Normal muscle tone, no cerebellar symptoms. Sensation intact.  Pysch: Awake and oriented X 3, normal affect, Insight and Judgment appropriate.   Assessment and Plan  1. Elevated BP, screening  - EKG 12-Lead  2. Hyperlipidemia  - Lipid panel  3. Abnormal glucose, screening  - Hemoglobin A1c - Insulin, random  4. Vitamin D deficiency  - Vit D  25 hydroxy   5. Other fatigue  - Vitamin B12 - Iron and TIBC - TSH  6. Medication management  - Urine Microscopic - CBC with Differential/Platelet - BASIC METABOLIC PANEL WITH GFR - Hepatic function panel - Magnesium  7. Screening for rectal cancer  - POC Hemoccult Bld/Stl   8. Screening examination for pulmonary tuberculosis  - PPD   Continue prudent diet as discussed, weight control, BP monitoring, regular exercise, and medications. Discussed med's effects and SE's. Screening labs and tests as requested with regular follow-up as recommended. Over 40 minutes of exam, counseling, chart review was performed.

## 2015-03-14 LAB — URINALYSIS, MICROSCOPIC ONLY
Bacteria, UA: NONE SEEN
Casts: NONE SEEN
Crystals: NONE SEEN
SQUAMOUS EPITHELIAL / LPF: NONE SEEN

## 2015-03-14 LAB — VITAMIN D 25 HYDROXY (VIT D DEFICIENCY, FRACTURES): Vit D, 25-Hydroxy: 68 ng/mL (ref 30–100)

## 2015-03-14 LAB — INSULIN, RANDOM: Insulin: 9 u[IU]/mL (ref 2.0–19.6)

## 2015-03-24 ENCOUNTER — Other Ambulatory Visit: Payer: Self-pay | Admitting: *Deleted

## 2015-03-24 DIAGNOSIS — Z1212 Encounter for screening for malignant neoplasm of rectum: Secondary | ICD-10-CM

## 2015-03-24 LAB — POC HEMOCCULT BLD/STL (HOME/3-CARD/SCREEN)
Card #2 Fecal Occult Blod, POC: NEGATIVE
Card #3 Fecal Occult Blood, POC: NEGATIVE
Fecal Occult Blood, POC: NEGATIVE

## 2015-03-25 ENCOUNTER — Other Ambulatory Visit: Payer: Self-pay | Admitting: Physician Assistant

## 2015-07-31 ENCOUNTER — Ambulatory Visit (INDEPENDENT_AMBULATORY_CARE_PROVIDER_SITE_OTHER): Payer: BLUE CROSS/BLUE SHIELD | Admitting: Internal Medicine

## 2015-07-31 VITALS — BP 106/64 | HR 58 | Temp 97.8°F | Ht 67.0 in | Wt 143.0 lb

## 2015-07-31 DIAGNOSIS — J069 Acute upper respiratory infection, unspecified: Secondary | ICD-10-CM

## 2015-07-31 MED ORDER — AZITHROMYCIN 250 MG PO TABS: ORAL_TABLET | ORAL | Status: DC

## 2015-07-31 MED ORDER — PREDNISONE 20 MG PO TABS: ORAL_TABLET | ORAL | Status: DC

## 2015-07-31 NOTE — Patient Instructions (Signed)
Please start taking the prednisone and take it until it is gone.  Please make sure that you use nasal saline as often as you can tolerate.    Take claritin, zyrtec or allegra daily.  Use 2 sprays of flonase, nasacort, or rhinocort nightly before bed.  Please drink plenty of fluids.  If you have no improvements, you develop fever or have any concerning symptoms take the zpak until it is gone.

## 2015-07-31 NOTE — Progress Notes (Signed)
Patient ID: Lauren Fry, female   DOB: [birth year], 59 y.o.   MRN: [id number]  HPI  Patient presents to the office for evaluation of cough.  It has been going on for 1 weeks.  Patient reports night > day, wet, worse with lying down.  They also endorse change in voice, chills, postnasal drip, sputum production and sore throat, sinus pressure and pain, nose bleeds.  .  They have tried antitussives or antihistamines.  They report that nothing has worked.  They denies other sick contacts.   Review of Systems  Constitutional: Positive for chills and malaise/fatigue. Negative for fever.  HENT: Positive for congestion, nosebleeds and sore throat.   Respiratory: Positive for cough and sputum production. Negative for shortness of breath and wheezing.   Cardiovascular: Negative for chest pain, palpitations and leg swelling.  Neurological: Positive for headaches.    PE:  Filed Vitals:   07/31/15 1637  BP: 106/64  Pulse: 58  Temp: 97.8 F (36.6 C)    General:  Alert and non-toxic, WDWN, NAD HEENT: NCAT, PERLA, EOM normal, no occular discharge or erythema.  Nasal mucosal edema with sinus tenderness to palpation.  Oropharynx clear with minimal oropharyngeal edema and erythema.  Mucous membranes moist and pink. Neck:  Cervical adenopathy Chest:  RRR no MRGs.  Lungs clear to auscultation A&P with no wheezes rhonchi or rales.   Abdomen: +BS x 4 quadrants, soft, non-tender, no guarding, rigidity, or rebound. Skin: warm and dry no rash Neuro: A&Ox4, CN II-XII grossly intact  Assessment and Plan:   1. Acute URI  -prednisone -zpak -mucinex -nasal saline -antihistamine

## 2015-08-07 ENCOUNTER — Other Ambulatory Visit: Payer: Self-pay | Admitting: Internal Medicine

## 2015-08-07 ENCOUNTER — Other Ambulatory Visit: Payer: Self-pay | Admitting: Physician Assistant

## 2015-08-08 ENCOUNTER — Other Ambulatory Visit: Payer: Self-pay | Admitting: Internal Medicine

## 2015-08-08 ENCOUNTER — Telehealth: Payer: Self-pay | Admitting: Internal Medicine

## 2015-08-08 NOTE — Telephone Encounter (Signed)
Left message on machine at 3:34pm.

## 2015-08-08 NOTE — Telephone Encounter (Signed)
Left message on machine at 10:20am.

## 2015-08-08 NOTE — Telephone Encounter (Signed)
Patient called complaining of continuing sore throat after taking prednisone and also claritin

## 2015-09-04 ENCOUNTER — Other Ambulatory Visit: Payer: Self-pay

## 2015-09-04 ENCOUNTER — Other Ambulatory Visit: Payer: Self-pay | Admitting: *Deleted

## 2015-09-04 MED ORDER — FAMCICLOVIR 500 MG PO TABS
500.0000 mg | ORAL_TABLET | Freq: Every day | ORAL | Status: DC
Start: 2015-09-04 — End: 2015-10-06

## 2015-10-06 ENCOUNTER — Other Ambulatory Visit: Payer: Self-pay | Admitting: Internal Medicine

## 2015-10-06 ENCOUNTER — Other Ambulatory Visit: Payer: Self-pay | Admitting: *Deleted

## 2015-10-06 MED ORDER — FAMCICLOVIR 500 MG PO TABS: 500.0000 mg | ORAL_TABLET | Freq: Two times a day (BID) | ORAL | Status: DC

## 2015-10-19 ENCOUNTER — Other Ambulatory Visit: Payer: Self-pay

## 2015-10-19 DIAGNOSIS — Z1231 Encounter for screening mammogram for malignant neoplasm of breast: Secondary | ICD-10-CM

## 2015-11-23 ENCOUNTER — Ambulatory Visit
Admission: RE | Admit: 2015-11-23 | Discharge: 2015-11-23 | Disposition: A | Discharge status: Home / Self Care | Payer: BLUE CROSS/BLUE SHIELD | Source: Ambulatory Visit

## 2015-11-23 DIAGNOSIS — Z1231 Encounter for screening mammogram for malignant neoplasm of breast: Secondary | ICD-10-CM

## 2016-03-20 ENCOUNTER — Encounter: Payer: Self-pay | Admitting: Internal Medicine

## 2016-03-20 DIAGNOSIS — Z79899 Other long term (current) drug therapy: Secondary | ICD-10-CM | POA: Insufficient documentation

## 2016-03-20 NOTE — Patient Instructions (Signed)
Recommend Adult Low Dose Aspirin or   coated  Aspirin 81 mg daily   To reduce risk of Colon Cancer 20 %,   Skin Cancer 26 % ,   Melanoma 46%   and   Pancreatic cancer 60%   ++++++++++++++++++++++++++++++++++++++++++++++++++++++ Vitamin D goal   is between 70-100.   Please make sure that you are taking your Vitamin D as directed.   It is very important as a natural anti-inflammatory   helping hair, skin, and nails, as well as reducing stroke and heart attack risk.   It helps your bones and helps with mood.  It also decreases numerous cancer risks so please take it as directed.   Low Vit D is associated with a 200-300% higher risk for CANCER   and 200-300% higher risk for HEART   ATTACK  &  STROKE.   .....................................Marland Kitchen  It is also associated with higher death rate at younger ages,   autoimmune diseases like Rheumatoid arthritis, Lupus, Multiple Sclerosis.     Also many other serious conditions, like depression, Alzheimer's  Dementia, infertility, muscle aches, fatigue, fibromyalgia - just to name a few.  ++++++++++++++++++++++++++++++++++++++++++++++++  Recommend the book "The END of DIETING" by Dr Excell Seltzer   & the book "The END of DIABETES " by Dr Excell Seltzer  At [email or web address] - get book & Audio CD's     Being diabetic has a  300% increased risk for heart attack, stroke, cancer, and alzheimer- type vascular dementia. It is very important that you work harder with diet by avoiding all foods that are white. Avoid white rice (brown & wild rice is OK), white potatoes (sweetpotatoes in moderation is OK), White bread or wheat bread or anything made out of white flour like bagels, donuts, rolls, buns, biscuits, cakes, pastries, cookies, pizza crust, and pasta (made from white flour & egg whites) - vegetarian pasta or spinach or wheat pasta is OK. Multigrain breads like Arnold's or Pepperidge [employer], or multigrain sandwich thins or flatbreads.  Diet,  exercise and weight loss can reverse and cure diabetes in the early stages.  Diet, exercise and weight loss is very important in the control and prevention of complications of diabetes which affects every system in your body, ie. Brain - dementia/stroke, eyes - glaucoma/blindness, heart - heart attack/heart failure, kidneys - dialysis, stomach - gastric paralysis, intestines - malabsorption, nerves - severe painful neuritis, circulation - gangrene & loss of a leg(s), and finally cancer and Alzheimers.    I recommend avoid fried & greasy foods,  sweets/candy, white rice (brown or wild rice or Quinoa is OK), white potatoes (sweet potatoes are OK) - anything made from white flour - bagels, doughnuts, rolls, buns, biscuits,white and wheat breads, pizza crust and traditional pasta made of white flour & egg white(vegetarian pasta or spinach or wheat pasta is OK).  Multi-grain bread is OK - like multi-grain flat bread or sandwich thins. Avoid alcohol in excess. Exercise is also important.    Eat all the vegetables you want - avoid meat, especially red meat and dairy - especially cheese.  Cheese is the most concentrated form of trans-fats which is the worst thing to clog up our arteries. Veggie cheese is OK which can be found in the fresh produce section at [employer] or [employer] or [employer]  ++++++++++++++++++++++++++++++++++++++++++++++++++ DASH Eating Plan  DASH stands for "Dietary Approaches to Stop Hypertension."   The DASH eating plan is a healthy eating plan that has been shown to reduce high blood  pressure (hypertension). Additional health benefits may include reducing the risk of type 2 diabetes mellitus, heart disease, and stroke. The DASH eating plan may also help with weight loss.  WHAT DO I NEED TO KNOW ABOUT THE DASH EATING PLAN?  For the DASH eating plan, you will follow these general guidelines:  Choose foods with a percent daily value for sodium of less than 5% (as listed on the food  label).  Use salt-free seasonings or herbs instead of table salt or sea salt.  Check with your health care provider or pharmacist before using salt substitutes.  Eat lower-sodium products, often labeled as "lower sodium" or "no salt added."  Eat fresh foods.  Eat more vegetables, fruits, and low-fat dairy products.    Choose whole grains. Look for the word "whole" as the first word in the ingredient list.  Choose fish   Limit sweets, desserts, sugars, and sugary drinks.  Choose heart-healthy fats.  Eat veggie cheese   Eat more home-cooked food and less restaurant, buffet, and fast food.  Limit fried foods.  [place]uffaker foods using methods other than frying.  Limit canned vegetables. If you do use them, rinse them well to decrease the sodium.  When eating at a restaurant, ask that your food be prepared with less salt, or no salt if possible.                      WHAT FOODS CAN I EAT?  Read Dr Fara Olden Fuhrman's books on The End of Dieting & The End of Diabetes  Grains  Whole grain or whole wheat bread. Brown rice. Whole grain or whole wheat pasta. Quinoa, bulgur, and whole grain cereals. Low-sodium cereals. Corn or whole wheat flour tortillas. Whole grain cornbread. Whole grain crackers. Low-sodium crackers.  Vegetables  Fresh or frozen vegetables (raw, steamed, roasted, or grilled). Low-sodium or reduced-sodium tomato and vegetable juices. Low-sodium or reduced-sodium tomato sauce and paste. Low-sodium or reduced-sodium canned vegetables.   Fruits  All fresh, canned (in natural juice), or frozen fruits.  Protein Products   All fish and seafood.  Dried beans, peas, or lentils. Unsalted nuts and seeds. Unsalted canned beans.  Dairy  Low-fat dairy products, such as skim or 1% milk, 2% or reduced-fat cheeses, low-fat ricotta or cottage cheese, or plain low-fat yogurt. Low-sodium or reduced-sodium cheeses.  Fats and Oils  Tub margarines without trans fats. Light or  reduced-fat mayonnaise and salad dressings (reduced sodium). Avocado. Safflower, olive, or canola oils. Natural peanut or almond butter.  Other  Unsalted popcorn and pretzels. The items listed above may not be a complete list of recommended foods or beverages. Contact your dietitian for more options.  +++++++++++++++++++++++++++++++++++++++++++  WHAT FOODS ARE NOT RECOMMENDED?  Grains/ White flour or wheat flour  White bread. White pasta. White rice. Refined cornbread. Bagels and croissants. Crackers that contain trans fat.  Vegetables  Creamed or fried vegetables. Vegetables in a . Regular canned vegetables. Regular canned tomato sauce and paste. Regular tomato and vegetable juices.  Fruits  Dried fruits. Canned fruit in light or heavy syrup. Fruit juice.  Meat and Other Protein Products  Meat in general - RED mwaet & White meat.  Fatty cuts of meat. Ribs, chicken wings, bacon, sausage, bologna, salami, chitterlings, fatback, hot dogs, bratwurst, and packaged luncheon meats.  Dairy  Whole or 2% milk, cream, half-and-half, and cream cheese. Whole-fat or sweetened yogurt. Full-fat cheeses or blue cheese. Nondairy creamers and whipped toppings. Processed cheese, cheese spreads, or  cheese curds.  Condiments  Onion and garlic salt, seasoned salt, table salt, and sea salt. Canned and packaged gravies. Worcestershire sauce. Tartar sauce. Barbecue sauce. Teriyaki sauce. Soy sauce, including reduced sodium. Steak sauce. Fish sauce. Oyster sauce. Cocktail sauce. Horseradish. Ketchup and mustard. Meat flavorings and tenderizers. Bouillon cubes. Hot sauce. Tabasco sauce. Marinades. Taco seasonings. Relishes.  Fats and Oils Butter, stick margarine, lard, shortening and bacon fat. Coconut, palm kernel, or palm oils. Regular salad dressings.  Pickles and olives. Salted popcorn and pretzels.  The items listed above may not be a complete list of foods and beverages to avoid.   Preventive  Care for Adults  A healthy lifestyle and preventive care can promote health and wellness. Preventive health guidelines for women include the following key practices.  A routine yearly physical is a good way to check with your health care provider about your health and preventive screening. It is a chance to share any concerns and updates on your health and to receive a thorough exam.  Visit your dentist for a routine exam and preventive care every 6 months. Brush your teeth twice a day and floss once a day. Good oral hygiene prevents tooth decay and gum disease.  The frequency of eye exams is based on your age, health, family medical history, use of contact lenses, and other factors. Follow your health care provider's recommendations for frequency of eye exams.  Eat a healthy diet. Foods like vegetables, fruits, whole grains, low-fat dairy products, and lean protein foods contain the nutrients you need without too many calories. Decrease your intake of foods high in solid fats, added sugars, and salt. Eat the right amount of calories for you.Get information about a proper diet from your health care provider, if necessary.  Regular physical exercise is one of the most important things you can do for your health. Most adults should get at least 150 minutes of moderate-intensity exercise (any activity that increases your heart rate and causes you to sweat) each week. In addition, most adults need muscle-strengthening exercises on 2 or more days a week.  Maintain a healthy weight. The body mass index (BMI) is a screening tool to identify possible weight problems. It provides an estimate of body fat based on height and weight. Your health care provider can find your BMI and can help you achieve or maintain a healthy weight.For adults 20 years and older:  A BMI below 18.5 is considered underweight.  A BMI of 18.5 to 24.9 is normal.  A BMI of 25 to 29.9 is considered overweight.  A BMI of 30 and  above is considered obese.  Maintain normal blood lipids and cholesterol levels by exercising and minimizing your intake of saturated fat. Eat a balanced diet with plenty of fruit and vegetables. Blood tests for lipids and cholesterol should begin at age 21 and be repeated every 5 years. If your lipid or cholesterol levels are high, you are over 50, or you are at high risk for heart disease, you may need your cholesterol levels checked more frequently.Ongoing high lipid and cholesterol levels should be treated with medicines if diet and exercise are not working.  If you smoke, find out from your health care provider how to quit. If you do not use tobacco, do not start.  Lung cancer screening is recommended for adults aged 31-80 years who are at high risk for developing lung cancer because of a history of smoking. A yearly low-dose CT scan of the lungs is recommended  for people who have at least a 30-pack-year history of smoking and are a current smoker or have quit within the past 15 years. A pack year of smoking is smoking an average of 1 pack of cigarettes a day for 1 year (for example: 1 pack a day for 30 years or 2 packs a day for 15 years). Yearly screening should continue until the smoker has stopped smoking for at least 15 years. Yearly screening should be stopped for people who develop a health problem that would prevent them from having lung cancer treatment.  High blood pressure causes heart disease and increases the risk of stroke. Your blood pressure should be checked at least every 1 to 2 years. Ongoing high blood pressure should be treated with medicines if weight loss and exercise do not work.  If you are 30-80 years old, ask your health care provider if you should take aspirin to prevent strokes.  Diabetes screening involves taking a blood sample to check your fasting blood sugar level. This should be done once every 3 years, after age 61, if you are within normal weight and without risk  factors for diabetes. Testing should be considered at a younger age or be carried out more frequently if you are overweight and have at least 1 risk factor for diabetes.  Breast cancer screening is essential preventive care for women. You should practice "breast self-awareness." This means understanding the normal appearance and feel of your breasts and may include breast self-examination. Any changes detected, no matter how small, should be reported to a health care provider. Women in their 77s and 30s should have a clinical breast exam (CBE) by a health care provider as part of a regular health exam every 1 to 3 years. After age 81, women should have a CBE every year. Starting at age 20, women should consider having a mammogram (breast X-ray test) every year. Women who have a family history of breast cancer should talk to their health care provider about genetic screening. Women at a high risk of breast cancer should talk to their health care providers about having an MRI and a mammogram every year.  Breast cancer gene (BRCA)-related cancer risk assessment is recommended for women who have family members with BRCA-related cancers. BRCA-related cancers include breast, ovarian, tubal, and peritoneal cancers. Having family members with these cancers may be associated with an increased risk for harmful changes (mutations) in the breast cancer genes BRCA1 and BRCA2. Results of the assessment will determine the need for genetic counseling and BRCA1 and BRCA2 testing.  Routine pelvic exams to screen for cancer are no longer recommended for nonpregnant women who are considered low risk for cancer of the pelvic organs (ovaries, uterus, and vagina) and who do not have symptoms. Ask your health care provider if a screening pelvic exam is right for you.  If you have had past treatment for cervical cancer or a condition that could lead to cancer, you need Pap tests and screening for cancer for at least 20 years after  your treatment. If Pap tests have been discontinued, your risk factors (such as having a new sexual partner) need to be reassessed to determine if screening should be resumed. Some women have medical problems that increase the chance of getting cervical cancer. In these cases, your health care provider may recommend more frequent screening and Pap tests.  Colorectal cancer can be detected and often prevented. Most routine colorectal cancer screening begins at the age of 22 years and  continues through age 74 years. However, your health care provider may recommend screening at an earlier age if you have risk factors for colon cancer. On a yearly basis, your health care provider may provide home test kits to check for hidden blood in the stool. Use of a small camera at the end of a tube, to directly examine the colon (sigmoidoscopy or colonoscopy), can detect the earliest forms of colorectal cancer. Talk to your health care provider about this at age 22, when routine screening begins. Direct exam of the colon should be repeated every 5-10 years through age 76 years, unless early forms of pre-cancerous polyps or small growths are found.  Hepatitis C blood testing is recommended for all people born from [birth year] through 1967 and any individual with known risks for hepatitis C.  Pra  Osteoporosis is a disease in which the bones lose minerals and strength with aging. This can result in serious bone fractures or breaks. The risk of osteoporosis can be identified using a bone density scan. Women ages 88 years and over and women at risk for fractures or osteoporosis should discuss screening with their health care providers. Ask your health care provider whether you should take a calcium supplement or vitamin D to reduce the rate of osteoporosis.  Menopause can be associated with physical symptoms and risks. Hormone replacement therapy is available to decrease symptoms and risks. You should talk to your health care  provider about whether hormone replacement therapy is right for you.  Use sunscreen. Apply sunscreen liberally and repeatedly throughout the day. You should seek shade when your shadow is shorter than you. Protect yourself by wearing long sleeves, pants, a wide-brimmed hat, and sunglasses year round, whenever you are outdoors.  Once a month, do a whole body skin exam, using a mirror to look at the skin on your back. Tell your health care provider of new moles, moles that have irregular borders, moles that are larger than a pencil eraser, or moles that have changed in shape or color.  Stay current with required vaccines (immunizations).  Influenza vaccine. All adults should be immunized every year.  Tetanus, diphtheria, and acellular pertussis (Td, Tdap) vaccine. Pregnant women should receive 1 dose of Tdap vaccine during each pregnancy. The dose should be obtained regardless of the length of time since the last dose. Immunization is preferred during the 27th-36th week of gestation. An adult who has not previously received Tdap or who does not know her vaccine status should receive 1 dose of Tdap. This initial dose should be followed by tetanus and diphtheria toxoids (Td) booster doses every 10 years. Adults with an unknown or incomplete history of completing a 3-dose immunization series with Td-containing vaccines should begin or complete a primary immunization series including a Tdap dose. Adults should receive a Td booster every 10 years.  Varicella vaccine. An adult without evidence of immunity to varicella should receive 2 doses or a second dose if she has previously received 1 dose. Pregnant females who do not have evidence of immunity should receive the first dose after pregnancy. This first dose should be obtained before leaving the health care facility. The second dose should be obtained 4-8 weeks after the first dose.  Human papillomavirus (HPV) vaccine. Females aged 13-26 years who have not  received the vaccine previously should obtain the 3-dose series. The vaccine is not recommended for use in pregnant females. However, pregnancy testing is not needed before receiving a dose. If a female is found to  be pregnant after receiving a dose, no treatment is needed. In that case, the remaining doses should be delayed until after the pregnancy. Immunization is recommended for any person with an immunocompromised condition through the age of 14 years if she did not get any or all doses earlier. During the 3-dose series, the second dose should be obtained 4-8 weeks after the first dose. The third dose should be obtained 24 weeks after the first dose and 16 weeks after the second dose.  Zoster vaccine. One dose is recommended for adults aged 87 years or older unless certain conditions are present.  Measles, mumps, and rubella (MMR) vaccine. Adults born before [birth year] generally are considered immune to measles and mumps. Adults born in [birth year] or later should have 1 or more doses of MMR vaccine unless there is a contraindication to the vaccine or there is laboratory evidence of immunity to each of the three diseases. A routine second dose of MMR vaccine should be obtained at least 28 days after the first dose for students attending postsecondary schools, health care workers, or international travelers. People who received inactivated measles vaccine or an unknown type of measles vaccine during 1963-1967 should receive 2 doses of MMR vaccine. People who received inactivated mumps vaccine or an unknown type of mumps vaccine before 1979 and are at high risk for mumps infection should consider immunization with 2 doses of MMR vaccine. For females of childbearing age, rubella immunity should be determined. If there is no evidence of immunity, females who are not pregnant should be vaccinated. If there is no evidence of immunity, females who are pregnant should delay immunization until after pregnancy. Unvaccinated  health care workers born before [birth year] who lack laboratory evidence of measles, mumps, or rubella immunity or laboratory confirmation of disease should consider measles and mumps immunization with 2 doses of MMR vaccine or rubella immunization with 1 dose of MMR vaccine.  Pneumococcal 13-valent conjugate (PCV13) vaccine. When indicated, a person who is uncertain of her immunization history and has no record of immunization should receive the PCV13 vaccine. An adult aged 48 years or older who has certain medical conditions and has not been previously immunized should receive 1 dose of PCV13 vaccine. This PCV13 should be followed with a dose of pneumococcal polysaccharide (PPSV23) vaccine. The PPSV23 vaccine dose should be obtained at least 8 weeks after the dose of PCV13 vaccine. An adult aged 40 years or older who has certain medical conditions and previously received 1 or more doses of PPSV23 vaccine should receive 1 dose of PCV13. The PCV13 vaccine dose should be obtained 1 or more years after the last PPSV23 vaccine dose.    Pneumococcal polysaccharide (PPSV23) vaccine. When PCV13 is also indicated, PCV13 should be obtained first. All adults aged 66 years and older should be immunized. An adult younger than age 28 years who has certain medical conditions should be immunized. Any person who resides in a nursing home or long-term care facility should be immunized. An adult smoker should be immunized. People with an immunocompromised condition and certain other conditions should receive both PCV13 and PPSV23 vaccines. People with human immunodeficiency virus (HIV) infection should be immunized as soon as possible after diagnosis. Immunization during chemotherapy or radiation therapy should be avoided. Routine use of PPSV23 vaccine is not recommended for American Indians, [place] Natives, or people younger than 65 years unless there are medical conditions that require PPSV23 vaccine. When indicated, people who  have unknown immunization and have no record of  immunization should receive PPSV23 vaccine. One-time revaccination 5 years after the first dose of PPSV23 is recommended for people aged 19-64 years who have chronic kidney failure, nephrotic syndrome, asplenia, or immunocompromised conditions. People who received 1-2 doses of PPSV23 before age 15 years should receive another dose of PPSV23 vaccine at age 40 years or later if at least 5 years have passed since the previous dose. Doses of PPSV23 are not needed for people immunized with PPSV23 at or after age 68 years.  Preventive Services / Frequency   Ages 67 to 44 years  Blood pressure check.  Lipid and cholesterol check.  Lung cancer screening. / Every year if you are aged 37-80 years and have a 30-pack-year history of smoking and currently smoke or have quit within the past 15 years. Yearly screening is stopped once you have quit smoking for at least 15 years or develop a health problem that would prevent you from having lung cancer treatment.  Clinical breast exam.** / Every year after age 56 years.  BRCA-related cancer risk assessment.** / For women who have family members with a BRCA-related cancer (breast, ovarian, tubal, or peritoneal cancers).  Mammogram.** / Every year beginning at age 45 years and continuing for as long as you are in good health. Consult with your health care provider.  Pap test.** / Every 3 years starting at age 57 years through age 39 or 44 years with a history of 3 consecutive normal Pap tests.  HPV screening.** / Every 3 years from ages 39 years through ages 21 to 65 years with a history of 3 consecutive normal Pap tests.  Fecal occult blood test (FOBT) of stool. / Every year beginning at age 80 years and continuing until age 60 years. You may not need to do this test if you get a colonoscopy every 10 years.  Flexible sigmoidoscopy or colonoscopy.** / Every 5 years for a flexible sigmoidoscopy or every 10 years  for a colonoscopy beginning at age 44 years and continuing until age 71 years.  Hepatitis C blood test.** / For all people born from [birth year] through 1967 and any individual with known risks for hepatitis C.  Skin self-exam. / Monthly.  Influenza vaccine. / Every year.  Tetanus, diphtheria, and acellular pertussis (Tdap/Td) vaccine.** / Consult your health care provider. Pregnant women should receive 1 dose of Tdap vaccine during each pregnancy. 1 dose of Td every 10 years.  Varicella vaccine.** / Consult your health care provider. Pregnant females who do not have evidence of immunity should receive the first dose after pregnancy.  Zoster vaccine.** / 1 dose for adults aged 30 years or older.  Pneumococcal 13-valent conjugate (PCV13) vaccine.** / Consult your health care provider.  Pneumococcal polysaccharide (PPSV23) vaccine.** / 1 to 2 doses if you smoke cigarettes or if you have certain conditions.  Meningococcal vaccine.** / Consult your health care provider.  Hepatitis A vaccine.** / Consult your health care provider.  Hepatitis B vaccine.** / Consult your health care provider. Screening for abdominal aortic aneurysm (AAA)  by ultrasound is recommended for people over 50 who have history of high blood pressure or who are current or former smokers.

## 2016-03-20 NOTE — Progress Notes (Signed)
Patient ID: Lauren Fry, female   DOB: [birth year], 60 y.o.   MRN: [id number]   [facility] ADOL[facility]IC[facility]                   Unk Pinto, M.D.    [name]. [name], P.A.-C      Starlyn Skeans, P.A.-C   [facility]                [address] [place], [place] [id number] Telephone [phone] Telefax [phone]  __________________________________________________________________________  Annual Screening/Preventative Visit And Comprehensive Evaluation &  Examination     This very nice 60 y.o. DWF presents for a Wellness/Preventative Visit & comprehensive evaluation and management of multiple medical co-morbidities.  Patient is being screened for elevated BP, Prediabetes, Hyperlipidemia and Vitamin D Deficiency.      Patient is screened expectantly for elevated BP and denies any cardiac symptoms as chest pain, palpitations, shortness of breath, dizziness or ankle swelling. Today's BP: 104/68 mmHg      Patient has hx/o mildly elevated LDL Cholesterol in the past near controlled with diet and medications. Last lipids were not at goal with T Chol 188, HDL 59, Trig 106 and sl elevated LDL Chol 108.      Patient has prediabetes predating since 2011 with A1c 5.7%  and patient denies reactive hypoglycemic symptoms, visual blurring, diabetic polys, or paresthesias. Last A1c was 5.6% in May 2016.      Finally, patient has history of Vitamin D Deficiency and last Vitamin D was 45 in May 2016.  Medication Sig  . aspirin 81 MG  Chew 81 mg by mouth daily.  . OS-CAL 600 MG TABS Take 600 mg by mouth 2 (two) times daily with a meal.  . VITAMIN D  Take 5,000 Units by mouth.  . citalopram  40 MG tablet TAKE 1 TABLET BY MOUTH EVERY DAY FOR MOOD  . famciclovir 500 MG tablet Take 1 tablet (500 mg total) by mouth 2 (two) times daily.  . fish oil-omega-3  1000 MG  Take 1 g by mouth 2 (two) times daily.   . Magnesium 250 MG  TABS Take 250 mg by mouth daily.   . Milk Thistle 250 MG CAPS Take 1 capsule (250 mg total) by mouth daily.  . OCUVITE-LUTEIN  Take 1 capsule by mouth daily.  . Tumeric Takes  1 daily  . vitamin C  500 MG tablet Take 500 mg by mouth 2 (two) times daily.  . vitamin E 400 UNIT capsule Take 400 Units by mouth daily.    No Known Allergies   Past Medical History  Diagnosis Date  . Anxiety   . Hyperlipidemia   . Unspecified vitamin D deficiency   . HSV-2 (herpes simplex virus 2) infection   . Varicose veins    Health Maintenance  Topic Date Due  . INFLUENZA VACCINE  06/04/2016  . PAP SMEAR  10/12/2017  . MAMMOGRAM  11/22/2017  . COLONOSCOPY  01/29/2023  . TETANUS/TDAP  03/09/2024  . ZOSTAVAX  Completed  . Hepatitis C Screening  Completed  . HIV Screening  Completed   Immunization History  Administered Date(s) Administered  . PPD Test 03/09/2014, 03/13/2015, 03/21/2016  . Tdap 03/09/2014  . Zoster 08/26/2014   Past Surgical History  Procedure Laterality Date  . Lasik  1990  . [place]  History  Problem Relation Age of Onset  . Cancer Mother 54    lung  . Dementia Father   . Cancer Sister 75    colon  . Cancer Brother 1    colon   Social History  Substance Use Topics  . Smoking status: Former Smoker    Quit date: 11/04/1982  . Smokeless tobacco: None  . Alcohol Use: 2.4 oz/week    4 Glasses of wine per week    ROS Constitutional: Denies fever, chills, weight loss/gain, headaches, insomnia,  night sweats, and change in appetite. Does c/o fatigue. Eyes: Denies redness, blurred vision, diplopia, discharge, itchy, watery eyes.  ENT: Denies discharge, congestion, post nasal drip, epistaxis, sore throat, earache, hearing loss, dental pain, Tinnitus, Vertigo, Sinus pain, snoring.  Cardio: Denies chest pain, palpitations, irregular heartbeat, syncope, dyspnea, diaphoresis, orthopnea, PND, claudication, edema Respiratory: denies cough, dyspnea, DOE, pleurisy,  hoarseness, laryngitis, wheezing.  Gastrointestinal: Denies dysphagia, heartburn, reflux, water brash, pain, cramps, nausea, vomiting, bloating, diarrhea, constipation, hematemesis, melena, hematochezia, jaundice, hemorrhoids Genitourinary: Denies dysuria, frequency, urgency, nocturia, hesitancy, discharge, hematuria, flank pain Breast: Breast lumps, nipple discharge, bleeding.  Musculoskeletal: Denies arthralgia, myalgia, stiffness, Jt. Swelling, pain, limp, and strain/sprain. Denies falls. Skin: Denies puritis, rash, hives, warts, acne, eczema, changing in skin lesion Neuro: No weakness, tremor, incoordination, spasms, paresthesia, pain Psychiatric: Denies confusion, memory loss, sensory loss. Denies Depression. Endocrine: Denies change in weight, skin, hair change, nocturia, and paresthesia, diabetic polys, visual blurring, hyper / hypo glycemic episodes.  Heme/Lymph: No excessive bleeding, bruising, enlarged lymph nodes.  Physical Exam  BP 104/68 mmHg  Pulse 60  Temp(Src) 97.5 F (36.4 C)  Resp 16  Ht 5\' 7"  (1.702 m)  Wt 137 lb 12.8 oz (62.506 kg)  BMI 21.58 kg/m2  General Appearance: Well nourished and in no apparent distress. Eyes: PERRLA, EOMs, conjunctiva no swelling or erythema, normal fundi and vessels. Sinuses: No frontal/maxillary tenderness ENT/Mouth: EACs patent / TMs  nl. Nares clear without erythema, swelling, mucoid exudates. Oral hygiene is good. No erythema, swelling, or exudate. Tongue normal, non-obstructing. Tonsils not swollen or erythematous. Hearing normal.  Neck: Supple, thyroid normal. No bruits, nodes or JVD. Respiratory: Respiratory effort normal.  BS equal and clear bilateral without rales, rhonci, wheezing or stridor. Cardio: Heart sounds are normal with regular rate and rhythm and no murmurs, rubs or gallops. Peripheral pulses are normal and equal bilaterally without edema. No aortic or femoral bruits. Chest: symmetric with normal excursions and  percussion. Breasts: Symmetric, without lumps, nipple discharge, retractions, or fibrocystic changes.  Abdomen: Flat, soft with bowel sounds active. Nontender, no guarding, rebound, hernias, masses, or organomegaly.  Lymphatics: Non tender without lymphadenopathy.  Genitourinary:  Musculoskeletal: Full ROM all peripheral extremities, joint stability, 5/5 strength, and normal gait. Skin: Warm and dry without rashes, lesions, cyanosis, clubbing or  ecchymosis.  Neuro: Cranial nerves intact, reflexes equal bilaterally. Normal muscle tone, no cerebellar symptoms. Sensation intact.  Pysch: Alert and oriented X 3, normal affect, Insight and Judgment appropriate.   Assessment and Plan  1. Annual Preventative Screening Examination    - Microalbumin / creatinine urine ratio - EKG 12-Lead - [place], RETROPERITNL ABD,  LTD - POC Hemoccult Bld/Stl  - Urinalysis, Routine w reflex microscopic - Vitamin B12 - Iron and TIBC - CBC with Differential/Platelet - BASIC METABOLIC PANEL WITH GFR - Hepatic function panel - Magnesium - Lipid panel - TSH - Hemoglobin A1c - Insulin, random - VITAMIN D 25 Hydroxy  2. Elevated BP  -  Microalbumin / creatinine urine ratio - EKG 12-Lead - [place], RETROPERITNL ABD,  LTD - TSH  3. Hyperlipidemia  - Lipid panel - TSH  4. Other abnormal glucose  - Hemoglobin A1c - Insulin, random  5. Vitamin D deficiency  - VITAMIN D 25 Hydroxy   6. Screening for rectal cancer  - POC Hemoccult Bld/Stl   7. Other fatigue  - Vitamin B12 - Iron and TIBC - CBC with Differential/Platelet - TSH  8. Medication management  - Urinalysis, Routine w reflex microscopic - CBC with Differential/Platelet - BASIC METABOLIC PANEL WITH GFR - Hepatic function panel - Magnesium  9. Screening for ischemic heart disease   10. Screening for AAA (aortic abdominal aneurysm)   11. Aphthous stomatitis  - famciclovir (FAMVIR) 500 MG tablet; Take 1 tablet (500 mg total) by  mouth 2 (two) times daily.  Dispense: 180 tablet; Refill: 3  12. Screening examination for pulmonary tuberculosis  - PPD   Continue prudent diet as discussed, weight control, BP monitoring, regular exercise, and medications. Discussed med's effects and SE's. Screening labs and tests as requested with regular follow-up as recommended. Over 40 minutes of exam, counseling, chart review and high complex critical decision making was performed.

## 2016-03-21 ENCOUNTER — Ambulatory Visit (INDEPENDENT_AMBULATORY_CARE_PROVIDER_SITE_OTHER): Payer: BLUE CROSS/BLUE SHIELD | Admitting: Internal Medicine

## 2016-03-21 ENCOUNTER — Other Ambulatory Visit: Payer: Self-pay | Admitting: Internal Medicine

## 2016-03-21 ENCOUNTER — Encounter: Payer: Self-pay | Admitting: Internal Medicine

## 2016-03-21 VITALS — BP 104/68 | HR 60 | Temp 97.5°F | Resp 16 | Ht 67.0 in | Wt 137.8 lb

## 2016-03-21 DIAGNOSIS — Z111 Encounter for screening for respiratory tuberculosis: Secondary | ICD-10-CM | POA: Diagnosis not present

## 2016-03-21 DIAGNOSIS — Z0000 Encounter for general adult medical examination without abnormal findings: Secondary | ICD-10-CM

## 2016-03-21 DIAGNOSIS — E785 Hyperlipidemia, unspecified: Secondary | ICD-10-CM

## 2016-03-21 DIAGNOSIS — K120 Recurrent oral aphthae: Secondary | ICD-10-CM

## 2016-03-21 DIAGNOSIS — R030 Elevated blood-pressure reading, without diagnosis of hypertension: Secondary | ICD-10-CM | POA: Diagnosis not present

## 2016-03-21 DIAGNOSIS — IMO0001 Reserved for inherently not codable concepts without codable children: Secondary | ICD-10-CM

## 2016-03-21 DIAGNOSIS — R7309 Other abnormal glucose: Secondary | ICD-10-CM

## 2016-03-21 DIAGNOSIS — R5383 Other fatigue: Secondary | ICD-10-CM

## 2016-03-21 DIAGNOSIS — Z79899 Other long term (current) drug therapy: Secondary | ICD-10-CM | POA: Diagnosis not present

## 2016-03-21 DIAGNOSIS — Z136 Encounter for screening for cardiovascular disorders: Secondary | ICD-10-CM

## 2016-03-21 DIAGNOSIS — E559 Vitamin D deficiency, unspecified: Secondary | ICD-10-CM

## 2016-03-21 DIAGNOSIS — Z1212 Encounter for screening for malignant neoplasm of rectum: Secondary | ICD-10-CM

## 2016-03-21 DIAGNOSIS — Z0001 Encounter for general adult medical examination with abnormal findings: Secondary | ICD-10-CM

## 2016-03-21 LAB — BASIC METABOLIC PANEL WITH GFR
BUN: 18 mg/dL (ref 7–25)
CALCIUM: 9.2 mg/dL (ref 8.6–10.4)
CHLORIDE: 104 mmol/L (ref 98–110)
CO2: 26 mmol/L (ref 20–31)
Creat: 0.86 mg/dL (ref 0.50–0.99)
GFR, EST NON AFRICAN AMERICAN: 73 mL/min (ref >=60)
GFR, Est African American: 84 mL/min (ref >=60)
Glucose, Bld: 93 mg/dL (ref 65–99)
Potassium: 4.7 mmol/L (ref 3.5–5.3)
SODIUM: 139 mmol/L (ref 135–146)

## 2016-03-21 LAB — HEPATIC FUNCTION PANEL
ALT: 11 U/L (ref 6–29)
AST: 18 U/L (ref 10–35)
Albumin: 4.2 g/dL (ref 3.6–5.1)
Alkaline Phosphatase: 53 U/L (ref 33–130)
BILIRUBIN DIRECT: 0.1 mg/dL (ref <=0.2)
BILIRUBIN INDIRECT: 0.4 mg/dL (ref 0.2–1.2)
TOTAL PROTEIN: 6.5 g/dL (ref 6.1–8.1)
Total Bilirubin: 0.5 mg/dL (ref 0.2–1.2)

## 2016-03-21 LAB — CBC WITH DIFFERENTIAL/PLATELET
BASOS ABS: 50 {cells}/uL (ref 0–200)
Basophils Relative: 1 %
Eosinophils Absolute: 100 cells/uL (ref 15–500)
Eosinophils Relative: 2 %
HCT: 41.4 % (ref 35.0–45.0)
HEMOGLOBIN: 13.5 g/dL (ref 11.7–15.5)
LYMPHS ABS: 1900 {cells}/uL (ref 850–3900)
Lymphocytes Relative: 38 %
MCH: 30.2 pg (ref 27.0–33.0)
MCHC: 32.6 g/dL (ref 32.0–36.0)
MCV: 92.6 fL (ref 80.0–100.0)
MONO ABS: 400 {cells}/uL (ref 200–950)
MPV: 9.2 fL (ref 7.5–12.5)
Monocytes Relative: 8 %
Neutro Abs: 2550 cells/uL (ref 1500–7800)
Neutrophils Relative %: 51 %
Platelets: 228 10*3/uL (ref 140–400)
RBC: 4.47 MIL/uL (ref 3.80–5.10)
RDW: 13.5 % (ref 11.0–15.0)
WBC: 5 10*3/uL (ref 3.8–10.8)

## 2016-03-21 LAB — IRON AND TIBC
%SAT: 24 % (ref 11–50)
Iron: 82 ug/dL (ref 45–160)
TIBC: 344 ug/dL (ref 250–450)
UIBC: 262 ug/dL (ref 125–400)

## 2016-03-21 LAB — TSH: TSH: 1.56 mIU/L

## 2016-03-21 LAB — LIPID PANEL
CHOL/HDL RATIO: 3.5 ratio (ref <=5.0)
Cholesterol: 193 mg/dL (ref 125–200)
HDL: 55 mg/dL (ref >=46)
LDL Cholesterol: 123 mg/dL (ref <130)
Triglycerides: 73 mg/dL (ref <150)
VLDL: 15 mg/dL (ref <30)

## 2016-03-21 LAB — HEMOGLOBIN A1C
HEMOGLOBIN A1C: 5.5 % (ref <5.7)
MEAN PLASMA GLUCOSE: 111 mg/dL

## 2016-03-21 LAB — VITAMIN B12: VITAMIN B 12: 805 pg/mL (ref 200–1100)

## 2016-03-21 LAB — MAGNESIUM: MAGNESIUM: 1.9 mg/dL (ref 1.5–2.5)

## 2016-03-21 MED ORDER — FAMCICLOVIR 500 MG PO TABS
500.0000 mg | ORAL_TABLET | Freq: Two times a day (BID) | ORAL | Status: DC
Start: 2016-03-21 — End: 2017-04-22

## 2016-03-22 LAB — URINALYSIS, MICROSCOPIC ONLY
Bacteria, UA: NONE SEEN [HPF]
CASTS: NONE SEEN [LPF]
CRYSTALS: NONE SEEN [HPF]
RBC / HPF: NONE SEEN RBC/HPF (ref <=2)
Yeast: NONE SEEN [HPF]

## 2016-03-22 LAB — MICROALBUMIN / CREATININE URINE RATIO
CREATININE, URINE: 76 mg/dL (ref 20–320)
MICROALB UR: 0.5 mg/dL
Microalb Creat Ratio: 7 mcg/mg creat (ref <30)

## 2016-03-22 LAB — URINALYSIS, ROUTINE W REFLEX MICROSCOPIC
Bilirubin Urine: NEGATIVE
Glucose, UA: NEGATIVE
HGB URINE DIPSTICK: NEGATIVE
KETONES UR: NEGATIVE
NITRITE: NEGATIVE
Protein, ur: NEGATIVE
Specific Gravity, Urine: 1.014 (ref 1.001–1.035)
pH: 6.5 (ref 5.0–8.0)

## 2016-03-22 LAB — INSULIN, RANDOM: Insulin: 4.1 u[IU]/mL (ref 2.0–19.6)

## 2016-03-22 LAB — VITAMIN D 25 HYDROXY (VIT D DEFICIENCY, FRACTURES): VIT D 25 HYDROXY: 96 ng/mL (ref 30–100)

## 2016-03-23 LAB — URINE CULTURE: Colony Count: 80000

## 2016-04-02 ENCOUNTER — Other Ambulatory Visit: Payer: Self-pay | Admitting: Internal Medicine

## 2016-07-24 ENCOUNTER — Other Ambulatory Visit: Payer: Self-pay | Admitting: Obstetrics and Gynecology

## 2016-07-24 ENCOUNTER — Other Ambulatory Visit (HOSPITAL_COMMUNITY)
Admission: RE | Admit: 2016-07-24 | Discharge: 2016-07-24 | Disposition: A | Discharge status: Home / Self Care | Payer: BLUE CROSS/BLUE SHIELD | Source: Ambulatory Visit | Attending: Obstetrics and Gynecology | Admitting: Obstetrics and Gynecology

## 2016-07-24 DIAGNOSIS — Z01419 Encounter for gynecological examination (general) (routine) without abnormal findings: Secondary | ICD-10-CM | POA: Diagnosis not present

## 2016-07-25 LAB — CYTOLOGY - PAP

## 2016-10-08 ENCOUNTER — Other Ambulatory Visit: Payer: Self-pay

## 2016-10-08 ENCOUNTER — Other Ambulatory Visit: Payer: Self-pay | Admitting: Internal Medicine

## 2016-10-08 ENCOUNTER — Encounter: Payer: Self-pay | Admitting: Internal Medicine

## 2016-10-08 DIAGNOSIS — Z1212 Encounter for screening for malignant neoplasm of rectum: Secondary | ICD-10-CM

## 2016-10-08 DIAGNOSIS — Z0001 Encounter for general adult medical examination with abnormal findings: Secondary | ICD-10-CM

## 2016-10-08 LAB — POC HEMOCCULT BLD/STL (HOME/3-CARD/SCREEN)
Card #2 Fecal Occult Blod, POC: NEGATIVE
Card #3 Fecal Occult Blood, POC: NEGATIVE
FECAL OCCULT BLD: NEGATIVE

## 2016-10-08 MED ORDER — SCOPOLAMINE 1 MG/3DAYS TD PT72: 1.0000 | MEDICATED_PATCH | TRANSDERMAL | 12 refills | Status: DC

## 2016-10-08 NOTE — Progress Notes (Signed)
Patient going on a cruise.  Requesting scopalamine. Will send in.  Will advise not to drink alcohol with this.  If too expensive can buy dramamine or can send in meclizine.

## 2016-11-11 ENCOUNTER — Other Ambulatory Visit: Payer: Self-pay | Admitting: Internal Medicine

## 2016-11-11 DIAGNOSIS — Z1231 Encounter for screening mammogram for malignant neoplasm of breast: Secondary | ICD-10-CM

## 2016-12-06 ENCOUNTER — Ambulatory Visit
Admission: RE | Admit: 2016-12-06 | Discharge: 2016-12-06 | Disposition: A | Discharge status: Home / Self Care | Payer: BLUE CROSS/BLUE SHIELD | Source: Ambulatory Visit | Attending: Internal Medicine | Admitting: Internal Medicine

## 2016-12-06 DIAGNOSIS — Z1231 Encounter for screening mammogram for malignant neoplasm of breast: Secondary | ICD-10-CM

## 2016-12-09 ENCOUNTER — Other Ambulatory Visit: Payer: Self-pay | Admitting: Internal Medicine

## 2016-12-09 DIAGNOSIS — R928 Other abnormal and inconclusive findings on diagnostic imaging of breast: Secondary | ICD-10-CM

## 2016-12-11 ENCOUNTER — Other Ambulatory Visit: Payer: Self-pay

## 2016-12-13 ENCOUNTER — Other Ambulatory Visit: Payer: Self-pay | Admitting: Internal Medicine

## 2016-12-13 ENCOUNTER — Ambulatory Visit
Admission: RE | Admit: 2016-12-13 | Discharge: 2016-12-13 | Disposition: A | Discharge status: Home / Self Care | Payer: BLUE CROSS/BLUE SHIELD | Source: Ambulatory Visit | Attending: Internal Medicine | Admitting: Internal Medicine

## 2016-12-13 DIAGNOSIS — R928 Other abnormal and inconclusive findings on diagnostic imaging of breast: Secondary | ICD-10-CM

## 2016-12-13 DIAGNOSIS — N6489 Other specified disorders of breast: Secondary | ICD-10-CM

## 2016-12-16 ENCOUNTER — Ambulatory Visit
Admission: RE | Admit: 2016-12-16 | Discharge: 2016-12-16 | Disposition: A | Discharge status: Home / Self Care | Payer: BLUE CROSS/BLUE SHIELD | Source: Ambulatory Visit | Attending: Internal Medicine | Admitting: Internal Medicine

## 2016-12-16 ENCOUNTER — Other Ambulatory Visit: Payer: Self-pay | Admitting: Internal Medicine

## 2016-12-16 DIAGNOSIS — N6489 Other specified disorders of breast: Secondary | ICD-10-CM

## 2016-12-18 ENCOUNTER — Other Ambulatory Visit: Payer: Self-pay | Admitting: Internal Medicine

## 2017-01-20 ENCOUNTER — Other Ambulatory Visit: Payer: Self-pay | Admitting: General Surgery

## 2017-01-20 DIAGNOSIS — N6489 Other specified disorders of breast: Secondary | ICD-10-CM

## 2017-01-23 ENCOUNTER — Other Ambulatory Visit: Payer: Self-pay | Admitting: General Surgery

## 2017-01-23 DIAGNOSIS — N6489 Other specified disorders of breast: Secondary | ICD-10-CM

## 2017-02-19 ENCOUNTER — Encounter (HOSPITAL_BASED_OUTPATIENT_CLINIC_OR_DEPARTMENT_OTHER): Payer: Self-pay | Admitting: *Deleted

## 2017-02-24 ENCOUNTER — Ambulatory Visit
Admission: RE | Admit: 2017-02-24 | Discharge: 2017-02-24 | Disposition: A | Discharge status: Home / Self Care | Payer: BLUE CROSS/BLUE SHIELD | Source: Ambulatory Visit | Attending: General Surgery | Admitting: General Surgery

## 2017-02-24 ENCOUNTER — Encounter (HOSPITAL_BASED_OUTPATIENT_CLINIC_OR_DEPARTMENT_OTHER): Payer: Self-pay | Admitting: *Deleted

## 2017-02-24 DIAGNOSIS — N6489 Other specified disorders of breast: Secondary | ICD-10-CM

## 2017-02-24 NOTE — Progress Notes (Signed)
Instructed patient to drink boost by 0730, verbalized understanding.

## 2017-02-25 ENCOUNTER — Encounter (HOSPITAL_BASED_OUTPATIENT_CLINIC_OR_DEPARTMENT_OTHER)
Admission: RE | Disposition: A | Discharge status: Home / Self Care | Payer: Self-pay | Source: Ambulatory Visit | Attending: General Surgery

## 2017-02-25 ENCOUNTER — Ambulatory Visit (HOSPITAL_BASED_OUTPATIENT_CLINIC_OR_DEPARTMENT_OTHER): Payer: BLUE CROSS/BLUE SHIELD | Admitting: Anesthesiology

## 2017-02-25 ENCOUNTER — Ambulatory Visit
Admission: RE | Admit: 2017-02-25 | Discharge: 2017-02-25 | Disposition: A | Discharge status: Home / Self Care | Payer: BLUE CROSS/BLUE SHIELD | Source: Ambulatory Visit | Attending: General Surgery | Admitting: General Surgery

## 2017-02-25 ENCOUNTER — Encounter (HOSPITAL_BASED_OUTPATIENT_CLINIC_OR_DEPARTMENT_OTHER): Payer: Self-pay | Admitting: *Deleted

## 2017-02-25 ENCOUNTER — Ambulatory Visit (HOSPITAL_BASED_OUTPATIENT_CLINIC_OR_DEPARTMENT_OTHER)
Admission: RE | Admit: 2017-02-25 | Discharge: 2017-02-25 | Disposition: A | Discharge status: Home / Self Care | Payer: BLUE CROSS/BLUE SHIELD | Source: Ambulatory Visit | Attending: General Surgery | Admitting: General Surgery

## 2017-02-25 DIAGNOSIS — Z9841 Cataract extraction status, right eye: Secondary | ICD-10-CM | POA: Insufficient documentation

## 2017-02-25 DIAGNOSIS — Z87891 Personal history of nicotine dependence: Secondary | ICD-10-CM | POA: Diagnosis not present

## 2017-02-25 DIAGNOSIS — N6310 Unspecified lump in the right breast, unspecified quadrant: Secondary | ICD-10-CM | POA: Insufficient documentation

## 2017-02-25 DIAGNOSIS — Z79899 Other long term (current) drug therapy: Secondary | ICD-10-CM | POA: Diagnosis not present

## 2017-02-25 DIAGNOSIS — F419 Anxiety disorder, unspecified: Secondary | ICD-10-CM | POA: Insufficient documentation

## 2017-02-25 DIAGNOSIS — Z7982 Long term (current) use of aspirin: Secondary | ICD-10-CM | POA: Diagnosis not present

## 2017-02-25 DIAGNOSIS — N6489 Other specified disorders of breast: Secondary | ICD-10-CM

## 2017-02-25 HISTORY — DX: Unspecified lump in the right breast, unspecified quadrant: N63.10

## 2017-02-25 HISTORY — PX: BREAST LUMPECTOMY WITH RADIOACTIVE SEED LOCALIZATION: SHX6424

## 2017-02-25 HISTORY — PX: BREAST EXCISIONAL BIOPSY: SUR124

## 2017-02-25 SURGERY — BREAST LUMPECTOMY WITH RADIOACTIVE SEED LOCALIZATION
Anesthesia: General | Site: Breast | Laterality: Right

## 2017-02-25 MED ORDER — SCOPOLAMINE 1 MG/3DAYS TD PT72
MEDICATED_PATCH | TRANSDERMAL | Status: AC
  Filled 2017-02-25: qty 1

## 2017-02-25 MED ORDER — PROPOFOL 10 MG/ML IV BOLUS
INTRAVENOUS | Status: AC
  Filled 2017-02-25: qty 20

## 2017-02-25 MED ORDER — HYDROMORPHONE HCL 1 MG/ML IJ SOLN: 0.2500 mg | INTRAMUSCULAR | Status: DC | PRN

## 2017-02-25 MED ORDER — LACTATED RINGERS IV SOLN
INTRAVENOUS | Status: DC
  Administered 2017-02-25: 11:00:00 via INTRAVENOUS

## 2017-02-25 MED ORDER — LIDOCAINE 2% (20 MG/ML) 5 ML SYRINGE
INTRAMUSCULAR | Status: DC | PRN
  Administered 2017-02-25: 100 mg via INTRAVENOUS

## 2017-02-25 MED ORDER — ONDANSETRON HCL 4 MG/2ML IJ SOLN
INTRAMUSCULAR | Status: AC
  Filled 2017-02-25: qty 2

## 2017-02-25 MED ORDER — CEFAZOLIN SODIUM-DEXTROSE 2-4 GM/100ML-% IV SOLN: 2.0000 g | INTRAVENOUS | Status: DC

## 2017-02-25 MED ORDER — CHLORHEXIDINE GLUCONATE CLOTH 2 % EX PADS: 6.0000 | MEDICATED_PAD | Freq: Once | CUTANEOUS | Status: DC

## 2017-02-25 MED ORDER — BUPIVACAINE HCL (PF) 0.25 % IJ SOLN
INTRAMUSCULAR | Status: DC | PRN
  Administered 2017-02-25: 10 mL

## 2017-02-25 MED ORDER — ACETAMINOPHEN 500 MG PO TABS
1000.0000 mg | ORAL_TABLET | ORAL | Status: AC
  Administered 2017-02-25: 1000 mg via ORAL

## 2017-02-25 MED ORDER — FENTANYL CITRATE (PF) 100 MCG/2ML IJ SOLN
INTRAMUSCULAR | Status: AC
  Filled 2017-02-25: qty 2

## 2017-02-25 MED ORDER — CELECOXIB 200 MG PO CAPS
ORAL_CAPSULE | ORAL | Status: AC
  Filled 2017-02-25: qty 1

## 2017-02-25 MED ORDER — MEPERIDINE HCL 25 MG/ML IJ SOLN: 6.2500 mg | INTRAMUSCULAR | Status: DC | PRN

## 2017-02-25 MED ORDER — PROMETHAZINE HCL 25 MG/ML IJ SOLN
6.2500 mg | INTRAMUSCULAR | Status: DC | PRN
Start: 2017-02-25 — End: 2017-02-25

## 2017-02-25 MED ORDER — DEXAMETHASONE SODIUM PHOSPHATE 10 MG/ML IJ SOLN
INTRAMUSCULAR | Status: AC
  Filled 2017-02-25: qty 1

## 2017-02-25 MED ORDER — FENTANYL CITRATE (PF) 100 MCG/2ML IJ SOLN
50.0000 ug | INTRAMUSCULAR | Status: DC | PRN
  Administered 2017-02-25: 50 ug via INTRAVENOUS

## 2017-02-25 MED ORDER — GABAPENTIN 300 MG PO CAPS
300.0000 mg | ORAL_CAPSULE | ORAL | Status: AC
  Administered 2017-02-25: 300 mg via ORAL

## 2017-02-25 MED ORDER — OXYCODONE-ACETAMINOPHEN 10-325 MG PO TABS: 1.0000 | ORAL_TABLET | Freq: Four times a day (QID) | ORAL | 0 refills | Status: DC | PRN

## 2017-02-25 MED ORDER — DEXAMETHASONE SODIUM PHOSPHATE 4 MG/ML IJ SOLN
INTRAMUSCULAR | Status: DC | PRN
  Administered 2017-02-25: 10 mg via INTRAVENOUS

## 2017-02-25 MED ORDER — CEFAZOLIN SODIUM-DEXTROSE 2-4 GM/100ML-% IV SOLN
INTRAVENOUS | Status: AC
  Filled 2017-02-25: qty 100

## 2017-02-25 MED ORDER — SCOPOLAMINE 1 MG/3DAYS TD PT72
1.0000 | MEDICATED_PATCH | Freq: Once | TRANSDERMAL | Status: DC | PRN
  Administered 2017-02-25: 1.5 mg via TRANSDERMAL

## 2017-02-25 MED ORDER — EPHEDRINE SULFATE-NACL 50-0.9 MG/10ML-% IV SOSY
PREFILLED_SYRINGE | INTRAVENOUS | Status: DC | PRN
  Administered 2017-02-25: 10 mg via INTRAVENOUS
  Administered 2017-02-25: 5 mg via INTRAVENOUS

## 2017-02-25 MED ORDER — OXYCODONE HCL 5 MG PO TABS: 5.0000 mg | ORAL_TABLET | Freq: Once | ORAL | Status: DC | PRN

## 2017-02-25 MED ORDER — CELECOXIB 200 MG PO CAPS
200.0000 mg | ORAL_CAPSULE | ORAL | Status: AC
  Administered 2017-02-25: 200 mg via ORAL

## 2017-02-25 MED ORDER — EPHEDRINE 5 MG/ML INJ
INTRAVENOUS | Status: AC
  Filled 2017-02-25: qty 10

## 2017-02-25 MED ORDER — GABAPENTIN 300 MG PO CAPS
ORAL_CAPSULE | ORAL | Status: AC
  Filled 2017-02-25: qty 1

## 2017-02-25 MED ORDER — MIDAZOLAM HCL 2 MG/2ML IJ SOLN
INTRAMUSCULAR | Status: AC
  Filled 2017-02-25: qty 2

## 2017-02-25 MED ORDER — LIDOCAINE 2% (20 MG/ML) 5 ML SYRINGE
INTRAMUSCULAR | Status: AC
  Filled 2017-02-25: qty 5

## 2017-02-25 MED ORDER — PROPOFOL 10 MG/ML IV BOLUS
INTRAVENOUS | Status: DC | PRN
  Administered 2017-02-25: 160 mg via INTRAVENOUS

## 2017-02-25 MED ORDER — OXYCODONE HCL 5 MG/5ML PO SOLN: 5.0000 mg | Freq: Once | ORAL | Status: DC | PRN

## 2017-02-25 MED ORDER — MIDAZOLAM HCL 2 MG/2ML IJ SOLN
1.0000 mg | INTRAMUSCULAR | Status: DC | PRN
  Administered 2017-02-25: 2 mg via INTRAVENOUS

## 2017-02-25 MED ORDER — ONDANSETRON HCL 4 MG/2ML IJ SOLN
INTRAMUSCULAR | Status: DC | PRN
  Administered 2017-02-25: 4 mg via INTRAVENOUS

## 2017-02-25 MED ORDER — ACETAMINOPHEN 500 MG PO TABS
ORAL_TABLET | ORAL | Status: AC
  Filled 2017-02-25: qty 2

## 2017-02-25 SURGICAL SUPPLY — 63 items
ADH SKN CLS APL DERMABOND .7 (GAUZE/BANDAGES/DRESSINGS) ×1
BINDER BREAST LRG (GAUZE/BANDAGES/DRESSINGS) IMPLANT
BINDER BREAST MEDIUM (GAUZE/BANDAGES/DRESSINGS) ×2 IMPLANT
BINDER BREAST XLRG (GAUZE/BANDAGES/DRESSINGS) IMPLANT
BINDER BREAST XXLRG (GAUZE/BANDAGES/DRESSINGS) IMPLANT
BLADE SURG 15 STRL LF DISP TIS (BLADE) ×1 IMPLANT
BLADE SURG 15 STRL SS (BLADE) ×3
CANISTER SUC SOCK COL 7IN (MISCELLANEOUS) IMPLANT
CANISTER SUCT 1200ML W/VALVE (MISCELLANEOUS) IMPLANT
CHLORAPREP W/TINT 26ML (MISCELLANEOUS) ×3 IMPLANT
CLIP TI WIDE RED SMALL 6 (CLIP) IMPLANT
CLOSURE WOUND 1/2 X4 (GAUZE/BANDAGES/DRESSINGS) ×1
COVER BACK TABLE 60X90IN (DRAPES) ×3 IMPLANT
COVER MAYO STAND STRL (DRAPES) ×3 IMPLANT
COVER PROBE W GEL 5X96 (DRAPES) ×3 IMPLANT
DECANTER SPIKE VIAL GLASS SM (MISCELLANEOUS) IMPLANT
DERMABOND ADVANCED (GAUZE/BANDAGES/DRESSINGS) ×2
DERMABOND ADVANCED .7 DNX12 (GAUZE/BANDAGES/DRESSINGS) ×1 IMPLANT
DEVICE DUBIN W/COMP PLATE 8390 (MISCELLANEOUS) ×3 IMPLANT
DRAPE LAPAROSCOPIC ABDOMINAL (DRAPES) ×3 IMPLANT
DRAPE UTILITY XL STRL (DRAPES) ×3 IMPLANT
DRSG TEGADERM 4X4.75 (GAUZE/BANDAGES/DRESSINGS) IMPLANT
ELECT COATED BLADE 2.86 ST (ELECTRODE) ×3 IMPLANT
ELECT REM PT RETURN 9FT ADLT (ELECTROSURGICAL) ×3
ELECTRODE REM PT RTRN 9FT ADLT (ELECTROSURGICAL) ×1 IMPLANT
GAUZE SPONGE 4X4 12PLY STRL LF (GAUZE/BANDAGES/DRESSINGS) IMPLANT
GLOVE BIO SURGEON STRL SZ7 (GLOVE) ×6 IMPLANT
GLOVE BIOGEL PI IND STRL 6.5 (GLOVE) IMPLANT
GLOVE BIOGEL PI IND STRL 7.0 (GLOVE) IMPLANT
GLOVE BIOGEL PI IND STRL 7.5 (GLOVE) ×1 IMPLANT
GLOVE BIOGEL PI INDICATOR 6.5 (GLOVE) ×4
GLOVE BIOGEL PI INDICATOR 7.0 (GLOVE) ×2
GLOVE BIOGEL PI INDICATOR 7.5 (GLOVE) ×2
GLOVE ECLIPSE 6.5 STRL STRAW (GLOVE) ×2 IMPLANT
GLOVE SURG SS PI 6.5 STRL IVOR (GLOVE) ×2 IMPLANT
GOWN STRL REUS W/ TWL LRG LVL3 (GOWN DISPOSABLE) ×2 IMPLANT
GOWN STRL REUS W/TWL LRG LVL3 (GOWN DISPOSABLE) ×6
HEMOSTAT ARISTA ABSORB 3G PWDR (MISCELLANEOUS) IMPLANT
ILLUMINATOR WAVEGUIDE N/F (MISCELLANEOUS) IMPLANT
KIT MARKER MARGIN INK (KITS) ×3 IMPLANT
LIGHT WAVEGUIDE WIDE FLAT (MISCELLANEOUS) IMPLANT
NDL HYPO 25X1 1.5 SAFETY (NEEDLE) ×1 IMPLANT
NEEDLE HYPO 25X1 1.5 SAFETY (NEEDLE) ×3 IMPLANT
NS IRRIG 1000ML POUR BTL (IV SOLUTION) IMPLANT
PACK BASIN DAY SURGERY FS (CUSTOM PROCEDURE TRAY) ×3 IMPLANT
PENCIL BUTTON HOLSTER BLD 10FT (ELECTRODE) ×3 IMPLANT
SLEEVE SCD COMPRESS KNEE MED (MISCELLANEOUS) ×3 IMPLANT
SPONGE LAP 4X18 X RAY DECT (DISPOSABLE) ×3 IMPLANT
STRIP CLOSURE SKIN 1/2X4 (GAUZE/BANDAGES/DRESSINGS) ×2 IMPLANT
SUT MNCRL AB 4-0 PS2 18 (SUTURE) ×3 IMPLANT
SUT MON AB 5-0 PS2 18 (SUTURE) IMPLANT
SUT SILK 2 0 SH (SUTURE) IMPLANT
SUT VIC AB 2-0 SH 27 (SUTURE) ×3
SUT VIC AB 2-0 SH 27XBRD (SUTURE) ×1 IMPLANT
SUT VIC AB 3-0 SH 27 (SUTURE) ×3
SUT VIC AB 3-0 SH 27X BRD (SUTURE) ×1 IMPLANT
SUT VIC AB 5-0 PS2 18 (SUTURE) IMPLANT
SYR CONTROL 10ML LL (SYRINGE) ×3 IMPLANT
TOWEL OR 17X24 6PK STRL BLUE (TOWEL DISPOSABLE) ×3 IMPLANT
TOWEL OR NON WOVEN STRL DISP B (DISPOSABLE) ×3 IMPLANT
TUBE CONNECTING 20'X1/4 (TUBING)
TUBE CONNECTING 20X1/4 (TUBING) IMPLANT
YANKAUER SUCT BULB TIP NO VENT (SUCTIONS) IMPLANT

## 2017-02-25 NOTE — Op Note (Signed)
Preoperative diagnoses: right breast mass with discordant biopsy Postoperative diagnosis: Same as above Procedure:Rightbreast seed guided excisional biopsy Surgeon: Dr. Serita Grammes Anesthesia: Gen. Estimated blood loss: minimal Complications: None Drains: None Specimens:Rightbreast tissue with paint Sponge and needle count correct at completion Disposition to recovery stable  Indications: This is a 62 yof with right breast mm abnormality. We discussed excision with seed guidance. The clip had migrated and the lesion was marked with the seed.   Procedure: After informed consent was obtained she was then taken to the operating room. She was given cefazolin. Sequential compression devices were on her legs. She was placed under general anesthesia without complication. Her rightbreast was then prepped and draped in the standard sterile surgical fashion. A surgical timeout was then performed.  I located the radioactive seed with the neoprobe. I infiltrated marcaine in the area of the seed. I made a curvilinear scar in the uoq as the seed was near the skin. I then used the neoprobe to guide the excision of the seed and surrounding tissue.This was confirmed by the neoprobe. This was then taken for mammogram which confirmed removal of the seed and the clip. This was confirmed by radiology. This was then sent to pathology. Hemostasis was observed.I closed the breast tissue with a 2-0 Vicryl. The dermis was closed with 3-0 Vicryl and the skin with 4-0 Monocryl.Dermabond and steristrips were placed on the incision. A breast binder was placed. She was transferred to recovery stable

## 2017-02-25 NOTE — Interval H&P Note (Signed)
History and Physical Interval Note:  02/25/2017 10:04 AM  Docia Chuck  has presented today for surgery, with the diagnosis of RIGHT BREAST MASS  The various methods of treatment have been discussed with the patient and family. After consideration of risks, benefits and other options for treatment, the patient has consented to  Procedure(s): RIGHT BREAST LUMPECTOMY WITH RADIOACTIVE SEED LOCALIZATION (Right) as a surgical intervention .  The patient's history has been reviewed, patient examined, no change in status, stable for surgery.  I have reviewed the patient's chart and labs.  Questions were answered to the patient's satisfaction.     Jameah Rouser

## 2017-02-25 NOTE — H&P (Signed)
61 yof referred by Dr Melford Aase for new right breast mass. she has no personal history breast disease except for callbacks and has no family history breast/ovarian cancer. she had screening mm with c density breasts. on the right side there is previously noted architectural distortion immediately adjacent and inferior to a partially calcified cyst. US shows a 6 mm right breast mass 5 cm from nipple immediately adjacent to it there is area of distortion measuring 8 mm. this underwent biopsy and is benign. this is felt to be discordant by radiology and she is sent here for evaluation  Past Surgical History  Cataract Surgery  Right.  Diagnostic Studies History  Colonoscopy  1-5 years ago Mammogram  1-3 years ago Pap Smear  1-5 years ago  Allergies  No Known Allergies   Medication History  Famciclovir (500MG  Tablet, Oral) Active. Aspirin (81MG  Tablet, Oral) Active. Calcium Carbonate (600MG  Tablet, Oral) Active. Citalopram Hydrobromide (40MG  Tablet, Oral) Active. Fish Oil + D3 (1000-1000MG -UNIT Capsule, Oral) Active. Magnesium (250MG  Tablet, Oral) Active. Milk Thistle (250MG  Capsule, Oral) Active. Ocuvite Lutein 25 (25-5MG  Capsule, Oral) Active. Vitamin C (500MG  Tablet, Oral) Active. Vitamin E (400UNIT Capsule, Oral) Active. Medications Reconciled  Social History  Alcohol use  Occasional alcohol use. Caffeine use  Coffee. No drug use  Tobacco use  Former smoker.  Family History  Alcohol Abuse  Father. Cancer  Mother. Colon Cancer  Brother, Sister.  Pregnancy / Birth History  Age at menarche  22 years. Age of menopause  9-50 Grav[place]  3 Length (months) of breastfeeding  3-6 Maternal age  63-30 Para  61    Review of Systems  General Not Present- Appetite Loss, Chills, Fatigue, Fever, Night Sweats, Weight Gain and Weight Loss. Skin Not Present- Change in Wart/Mole, Dryness, Hives, Jaundice, New Lesions, Non-Healing Wounds, Rash and  Ulcer. HEENT Not Present- Earache, Hearing Loss, Hoarseness, Nose Bleed, Oral Ulcers, Ringing in the Ears, Seasonal Allergies, Sinus Pain, Sore Throat, Visual Disturbances, Wears glasses/contact lenses and Yellow Eyes. Respiratory Not Present- Bloody sputum, Chronic Cough, Difficulty Breathing, Snoring and Wheezing. Breast Not Present- Breast Mass, Breast Pain, Nipple Discharge and Skin Changes. Cardiovascular Not Present- Chest Pain, Difficulty Breathing Lying Down, Leg Cramps, Palpitations, Rapid Heart Rate, Shortness of Breath and Swelling of Extremities. Gastrointestinal Not Present- Abdominal Pain, Bloating, Bloody Stool, Change in Bowel Habits, Chronic diarrhea, Constipation, Difficulty Swallowing, Excessive gas, Gets full quickly at meals, Hemorrhoids, Indigestion, Nausea, Rectal Pain and Vomiting. Female Genitourinary Not Present- Frequency, Nocturia, Painful Urination, Pelvic Pain and Urgency. Musculoskeletal Not Present- Back Pain, Joint Pain, Joint Stiffness, Muscle Pain, Muscle Weakness and Swelling of Extremities. Neurological Not Present- Decreased Memory, Fainting, Headaches, Numbness, Seizures, Tingling, Tremor, Trouble walking and Weakness. Psychiatric Not Present- Anxiety, Bipolar, Change in Sleep Pattern, Depression, Fearful and Frequent crying. Endocrine Not Present- Cold Intolerance, Excessive Hunger, Hair Changes, Heat Intolerance, Hot flashes and New Diabetes. Hematology Not Present- Blood Thinners, Easy Bruising, Excessive bleeding, Gland problems, HIV and Persistent Infections.  Vitals Weight: 139.6 lb Height: 67in Body Surface Area: 1.74 m Body Mass Index: 21.86 kg/m  Temp.: 98.43F  Pulse: 64 (Regular)  BP: 100/60 (Sitting, Left Arm, Standard)  Physical Exam  General Mental Status-Alert. Orientation-Oriented X3. Chest and Lung Exam Chest and lung exam reveals -on auscultation, normal breath sounds, no adventitious sounds and normal vocal  resonance. Breast Nipples-No Discharge. Breast Lump-No Palpable Breast Mass. Note: hematoma right lateral breast Cardiovascular Cardiovascular examination reveals -normal heart sounds, regular rate and rhythm with no murmurs. Lymphatic  Head & Neck General Head & Neck Lymphatics: Bilateral - Description - Normal. Axillary General Axillary Region: Bilateral - Description - Normal. Note: no [place] adenopathy   Assessment & Plan  ABNORMAL MAMMOGRAM OF RIGHT BREAST (R92.8) Story: Right breast seed guided excision I think this is unlikely to be anything but benign. it is read as discordant by radiology. we discussed options including observation vs surgery. we discussed the pros/cons of both of these. we discussed seed guided excision with recovery and possibilities of upgrade on pathology (which I think is low). she is going to consider and call me back. if she does not desire surgery she needs 6 month follow up at breast center

## 2017-02-25 NOTE — Transfer of Care (Signed)
Immediate Anesthesia Transfer of Care Note  Patient: Lauren Fry  Procedure(s) Performed: Procedure(s) (LRB): RIGHT BREAST LUMPECTOMY WITH RADIOACTIVE SEED LOCALIZATION (Right)  Patient Location: PACU  Anesthesia Type: General  Level of Consciousness: awake, oriented, sedated and patient cooperative  Airway & Oxygen Therapy: Patient Spontanous Breathing and Patient connected to face mask oxygen  Post-op Assessment: Report given to PACU RN and Post -op Vital signs reviewed and stable  Post vital signs: Reviewed and stable  Complications: No apparent anesthesia complications Last Vitals:  Vitals:   02/25/17 1156 02/25/17 1157  BP: 105/65   Pulse: 64 62  Resp: 14 15  Temp:

## 2017-02-25 NOTE — Discharge Instructions (Signed)
[facility] °Office Phone Number [phone] ° °POST OP INSTRUCTIONS ° °Always review your discharge instruction sheet given to you by the facility where your surgery was performed. ° °IF YOU HAVE DISABILITY OR FAMILY LEAVE FORMS, YOU MUST BRING THEM TO THE OFFICE FOR PROCESSING.  DO NOT GIVE THEM TO YOUR DOCTOR. ° °1. A prescription for pain medication may be given to you upon discharge.  Take your pain medication as prescribed, if needed.  If narcotic pain medicine is not needed, then you may take acetaminophen (Tylenol), naprosyn (Alleve) or ibuprofen (Advil) as needed. °2. Take your usually prescribed medications unless otherwise directed °3. If you need a refill on your pain medication, please contact your pharmacy.  They will contact our office to request authorization.  Prescriptions will not be filled after 5pm or on week-ends. °4. You should eat very light the first 24 hours after surgery, such as soup, crackers, pudding, etc.  Resume your normal diet the day after surgery. °5. Most patients will experience some swelling and bruising in the breast.  Ice packs and a good support bra will help.  Wear the breast binder provided or a sports bra for 72 hours day and night.  After that wear a sports bra during the day until you return to the office. Swelling and bruising can take several days to resolve.  °6. It is common to experience some constipation if taking pain medication after surgery.  Increasing fluid intake and taking a stool softener will usually help or prevent this problem from occurring.  A mild laxative (Milk of Magnesia or Miralax) should be taken according to package directions if there are no bowel movements after 48 hours. °7. Unless discharge instructions indicate otherwise, you may remove your bandages 48 hours after surgery and you may shower at that time.  You may have steri-strips (small skin tapes) in place directly over the incision.  These strips should be left on the  skin for 7-10 days and will come off on their own.  If your surgeon used skin glue on the incision, you may shower in 24 hours.  The glue will flake off over the next 2-3 weeks.  Any sutures or staples will be removed at the office during your follow-up visit. °8. ACTIVITIES:  You may resume regular daily activities (gradually increasing) beginning the next day.  Wearing a good support bra or sports bra minimizes pain and swelling.  You may have sexual intercourse when it is comfortable. °a. You may drive when you no longer are taking prescription pain medication, you can comfortably wear a seatbelt, and you can safely maneuver your car and apply brakes. °b. RETURN TO WORK:  ______________________________________________________________________________________ °9. You should see your doctor in the office for a follow-up appointment approximately two weeks after your surgery.  Your doctor’s nurse will typically make your follow-up appointment when she calls you with your pathology report.  Expect your pathology report 3-4 business days after your surgery.  You may call to check if you do not hear from us after three days. °10. OTHER INSTRUCTIONS: _______________________________________________________________________________________________ _____________________________________________________________________________________________________________________________________ °_____________________________________________________________________________________________________________________________________ °_____________________________________________________________________________________________________________________________________ ° °WHEN TO CALL DR WAKEFIELD: °1. Fever over 101.0 °2. Nausea and/or vomiting. °3. Extreme swelling or bruising. °4. Continued bleeding from incision. °5. Increased pain, redness, or drainage from the incision. ° °The clinic staff is available to answer your questions during regular  business hours.  Please don’t hesitate to call and ask to speak to one of the nurses for clinical concerns.  If   you have a medical emergency, go to the nearest emergency room or call 911.  A surgeon from [facility] is always on call at the hospital. ° °For further questions, please visit [email or web address] ° ° ° ° ° °Post Anesthesia Home Care Instructions ° °Activity: °Get plenty of rest for the remainder of the day. A responsible individual must stay with you for 24 hours following the procedure.  °For the next 24 hours, DO NOT: °-Drive a car °-Operate machinery °-Drink alcoholic beverages °-Take any medication unless instructed by your physician °-Make any legal decisions or sign important papers. ° °Meals: °Start with liquid foods such as gelatin or soup. Progress to regular foods as tolerated. Avoid greasy, spicy, heavy foods. If nausea and/or vomiting occur, drink only clear liquids until the nausea and/or vomiting subsides. Call your physician if vomiting continues. ° °Special Instructions/Symptoms: °Your throat may feel dry or sore from the anesthesia or the breathing tube placed in your throat during surgery. If this causes discomfort, gargle with warm salt water. The discomfort should disappear within 24 hours. ° °If you had a scopolamine patch placed behind your ear for the management of post- operative nausea and/or vomiting: ° °1. The medication in the patch is effective for 72 hours, after which it should be removed.  Wrap patch in a tissue and discard in the trash. Wash hands thoroughly with soap and water. °2. You may remove the patch earlier than 72 hours if you experience unpleasant side effects which may include dry mouth, dizziness or visual disturbances. °3. Avoid touching the patch. Wash your hands with soap and water after contact with the patch. °  ° °

## 2017-02-25 NOTE — Anesthesia Preprocedure Evaluation (Signed)
Anesthesia Evaluation  Patient identified by MRN, date of birth, ID band Patient awake    Reviewed: Allergy & Precautions, NPO status , Patient's Chart, lab work & pertinent test results  Airway Mallampati: II  TM Distance: >3 FB Neck ROM: Full    Dental no notable dental hx.    Pulmonary former smoker,    Pulmonary exam normal breath sounds clear to auscultation       Cardiovascular negative cardio ROS Normal cardiovascular exam Rhythm:Regular Rate:Normal     Neuro/Psych PSYCHIATRIC DISORDERS Anxiety negative neurological ROS     GI/Hepatic negative GI ROS, Neg liver ROS,   Endo/Other  negative endocrine ROS  Renal/GU negative Renal ROS     Musculoskeletal negative musculoskeletal ROS (+)   Abdominal   Peds  Hematology negative hematology ROS (+)   Anesthesia Other Findings   Reproductive/Obstetrics negative OB ROS                            Anesthesia Physical Anesthesia Plan  ASA: II  Anesthesia Plan: General   Post-op Pain Management:    Induction: Intravenous  Airway Management Planned: LMA  Additional Equipment:   Intra-op Plan:   Post-operative Plan: Extubation in OR  Informed Consent: I have reviewed the patients History and Physical, chart, labs and discussed the procedure including the risks, benefits and alternatives for the proposed anesthesia with the patient or authorized representative who has indicated his/her understanding and acceptance.   Dental advisory given  Plan Discussed with: CRNA  Anesthesia Plan Comments:         Anesthesia Quick Evaluation

## 2017-02-25 NOTE — Anesthesia Postprocedure Evaluation (Signed)
Anesthesia Post Note  Patient: Lauren Fry  Procedure(s) Performed: Procedure(s) (LRB): RIGHT BREAST LUMPECTOMY WITH RADIOACTIVE SEED LOCALIZATION (Right)  Patient location during evaluation: PACU Anesthesia Type: General Level of consciousness: sedated and patient cooperative Pain management: pain level controlled Vital Signs Assessment: post-procedure vital signs reviewed and stable Respiratory status: spontaneous breathing Cardiovascular status: stable Anesthetic complications: no       Last Vitals:  Vitals:   02/25/17 1215 02/25/17 1246  BP: 108/70 (!) 113/46  Pulse: 62   Resp: 18 16  Temp:  36.7 C    Last Pain:  Vitals:   02/25/17 1246  TempSrc: Oral  PainSc: 0-No pain                 Nolon Nations

## 2017-02-25 NOTE — Anesthesia Procedure Notes (Signed)
Procedure Name: LMA Insertion Date/Time: 02/25/2017 11:10 AM Performed by: Denna Haggard D Pre-anesthesia Checklist: Patient identified, Emergency Drugs available, Suction available and Patient being monitored Patient Re-evaluated:Patient Re-evaluated prior to inductionOxygen Delivery Method: Circle system utilized Preoxygenation: Pre-oxygenation with 100% oxygen Intubation Type: IV induction Ventilation: Mask ventilation without difficulty LMA: LMA inserted LMA Size: 4.0 Number of attempts: 1 Airway Equipment and Method: Bite block Placement Confirmation: positive ETCO2 Tube secured with: Tape Dental Injury: Teeth and Oropharynx as per pre-operative assessment

## 2017-02-26 ENCOUNTER — Encounter (HOSPITAL_BASED_OUTPATIENT_CLINIC_OR_DEPARTMENT_OTHER): Payer: Self-pay | Admitting: General Surgery

## 2017-03-29 ENCOUNTER — Other Ambulatory Visit: Payer: Self-pay | Admitting: Internal Medicine

## 2017-04-22 ENCOUNTER — Ambulatory Visit (INDEPENDENT_AMBULATORY_CARE_PROVIDER_SITE_OTHER): Payer: BLUE CROSS/BLUE SHIELD | Admitting: Internal Medicine

## 2017-04-22 ENCOUNTER — Encounter: Payer: Self-pay | Admitting: Internal Medicine

## 2017-04-22 ENCOUNTER — Other Ambulatory Visit: Payer: Self-pay | Admitting: *Deleted

## 2017-04-22 VITALS — BP 108/62 | HR 63 | Temp 97.7°F | Resp 16 | Ht 66.0 in | Wt 138.6 lb

## 2017-04-22 DIAGNOSIS — R7303 Prediabetes: Secondary | ICD-10-CM

## 2017-04-22 DIAGNOSIS — R5383 Other fatigue: Secondary | ICD-10-CM

## 2017-04-22 DIAGNOSIS — Z0000 Encounter for general adult medical examination without abnormal findings: Secondary | ICD-10-CM | POA: Diagnosis not present

## 2017-04-22 DIAGNOSIS — E782 Mixed hyperlipidemia: Secondary | ICD-10-CM

## 2017-04-22 DIAGNOSIS — Z136 Encounter for screening for cardiovascular disorders: Secondary | ICD-10-CM | POA: Diagnosis not present

## 2017-04-22 DIAGNOSIS — R0989 Other specified symptoms and signs involving the circulatory and respiratory systems: Secondary | ICD-10-CM

## 2017-04-22 DIAGNOSIS — K120 Recurrent oral aphthae: Secondary | ICD-10-CM

## 2017-04-22 DIAGNOSIS — Z1212 Encounter for screening for malignant neoplasm of rectum: Secondary | ICD-10-CM

## 2017-04-22 DIAGNOSIS — Z79899 Other long term (current) drug therapy: Secondary | ICD-10-CM | POA: Diagnosis not present

## 2017-04-22 DIAGNOSIS — Z111 Encounter for screening for respiratory tuberculosis: Secondary | ICD-10-CM | POA: Diagnosis not present

## 2017-04-22 DIAGNOSIS — E559 Vitamin D deficiency, unspecified: Secondary | ICD-10-CM | POA: Diagnosis not present

## 2017-04-22 DIAGNOSIS — Z0001 Encounter for general adult medical examination with abnormal findings: Secondary | ICD-10-CM

## 2017-04-22 DIAGNOSIS — Z1211 Encounter for screening for malignant neoplasm of colon: Secondary | ICD-10-CM

## 2017-04-22 LAB — LIPID PANEL
CHOLESTEROL: 175 mg/dL (ref <200)
HDL: 53 mg/dL (ref >50)
LDL Cholesterol: 106 mg/dL — ABNORMAL HIGH (ref <100)
TRIGLYCERIDES: 82 mg/dL (ref <150)
Total CHOL/HDL Ratio: 3.3 Ratio (ref <5.0)
VLDL: 16 mg/dL (ref <30)

## 2017-04-22 LAB — IRON AND TIBC
%SAT: 30 % (ref 11–50)
Iron: 97 ug/dL (ref 45–160)
TIBC: 325 ug/dL (ref 250–450)
UIBC: 228 ug/dL

## 2017-04-22 LAB — CBC WITH DIFFERENTIAL/PLATELET
Basophils Absolute: 0 cells/uL (ref 0–200)
Basophils Relative: 0 %
EOS PCT: 2 %
Eosinophils Absolute: 92 cells/uL (ref 15–500)
HCT: 40.6 % (ref 35.0–45.0)
Hemoglobin: 13.1 g/dL (ref 11.7–15.5)
LYMPHS PCT: 36 %
Lymphs Abs: 1656 cells/uL (ref 850–3900)
MCH: 30.5 pg (ref 27.0–33.0)
MCHC: 32.3 g/dL (ref 32.0–36.0)
MCV: 94.6 fL (ref 80.0–100.0)
MPV: 10 fL (ref 7.5–12.5)
Monocytes Absolute: 460 cells/uL (ref 200–950)
Monocytes Relative: 10 %
NEUTROS PCT: 52 %
Neutro Abs: 2392 cells/uL (ref 1500–7800)
Platelets: 225 10*3/uL (ref 140–400)
RBC: 4.29 MIL/uL (ref 3.80–5.10)
RDW: 13.4 % (ref 11.0–15.0)
WBC: 4.6 10*3/uL (ref 3.8–10.8)

## 2017-04-22 LAB — BASIC METABOLIC PANEL WITH GFR
BUN: 25 mg/dL (ref 7–25)
CALCIUM: 9.1 mg/dL (ref 8.6–10.4)
CO2: 27 mmol/L (ref 20–31)
Chloride: 105 mmol/L (ref 98–110)
Creat: 0.73 mg/dL (ref 0.50–0.99)
GFR, EST NON AFRICAN AMERICAN: 89 mL/min (ref >=60)
Glucose, Bld: 87 mg/dL (ref 65–99)
Potassium: 4.7 mmol/L (ref 3.5–5.3)
SODIUM: 140 mmol/L (ref 135–146)

## 2017-04-22 LAB — HEPATIC FUNCTION PANEL
ALT: 11 U/L (ref 6–29)
AST: 17 U/L (ref 10–35)
Albumin: 4.2 g/dL (ref 3.6–5.1)
Alkaline Phosphatase: 53 U/L (ref 33–130)
BILIRUBIN DIRECT: 0.1 mg/dL (ref <=0.2)
BILIRUBIN INDIRECT: 0.4 mg/dL (ref 0.2–1.2)
TOTAL PROTEIN: 6.2 g/dL (ref 6.1–8.1)
Total Bilirubin: 0.5 mg/dL (ref 0.2–1.2)

## 2017-04-22 LAB — TSH: TSH: 0.2 mIU/L — ABNORMAL LOW

## 2017-04-22 MED ORDER — CITALOPRAM HYDROBROMIDE 40 MG PO TABS: ORAL_TABLET | ORAL | 3 refills | Status: DC

## 2017-04-22 MED ORDER — FAMCICLOVIR 500 MG PO TABS: 500.0000 mg | ORAL_TABLET | Freq: Two times a day (BID) | ORAL | 3 refills | Status: DC

## 2017-04-22 NOTE — Progress Notes (Signed)
[facility] Unk Pinto, M.D.      [name]. [name], P.A.-C [facility]                [address] [place], [place] [address] Telephone [phone] Telefax [phone]  Annual Screening/Preventative Visit & Comprehensive Evaluation &  Examination     This very nice 61 y.o. MWF presents for a Screening/Preventative Visit & comprehensive evaluation and management of multiple medical co-morbidities.  Patient is screened for HTN, Prediabetes, Hyperlipidemia and Vitamin D Deficiency. Patient had recent Rt breast Bx/lumpectomy on 4/24 /2018 by Dr Serita Grammes that was benign.       Patient's BP has been controlled at home and patient denies any cardiac symptoms as chest pain, palpitations, shortness of breath, dizziness or ankle swelling. Today's BP is at goal - 108/62.      Patient's hyperlipidemia is not controlled with diet.  Last lipids were not at goal: Lab Results  Component Value Date   CHOL 193 03/21/2016   HDL 55 03/21/2016   LDLCALC 123 03/21/2016   TRIG 73 03/21/2016   CHOLHDL 3.5 03/21/2016      Patient has prediabetes (A1c 5.7% in 2011 and 5.6% in 2016) and patient denies reactive hypoglycemic symptoms, visual blurring, diabetic polys, or paresthesias. Last A1c was at goal: Lab Results  Component Value Date   HGBA1C 5.5 03/21/2016      Finally, patient has history of Vitamin D Deficiency and last Vitamin D was at goal: Lab Results  Component Value Date   VD25OH 96 03/21/2016   Current Outpatient Prescriptions on File Prior to Visit  Medication Sig  . aspirin 81 MG chewable tablet Chew 81 mg by mouth daily.  . calcium carbonate (OS-CAL) 600 MG TABS tablet Take 600 mg by mouth 2 (two) times daily with a meal.  . Cholecalciferol (VITAMIN D PO) Take 5,000 Units by mouth.  . Magnesium 250 MG TABS Take 250 mg by mouth daily.   . Milk Thistle 250 MG CAPS Take 1 capsule (250 mg total)  by mouth daily.  Marland Kitchen OVER THE COUNTER MEDICATION Takes Tumeric 1 daily  . scopolamine (TRANSDERM-SCOP) 1 MG/3DAYS Place 1 patch (1.5 mg total) onto the skin every 3 (three) days.  . Turmeric 500 MG CAPS Take by mouth.  . vitamin C (ASCORBIC ACID) 500 MG tablet Take 500 mg by mouth 2 (two) times daily.   No Known Allergies  Past Medical History:  Diagnosis Date  . Anxiety   . Breast mass, right   . HSV-2 (herpes simplex virus 2) infection   . Hyperlipidemia   . Pre-diabetes   . Unspecified vitamin D deficiency   . Varicose veins    Health Maintenance  Topic Date Due  . INFLUENZA VACCINE  06/04/2017  . MAMMOGRAM  12/06/2018  . PAP SMEAR  07/25/2019  . COLONOSCOPY  01/29/2023  . TETANUS/TDAP  03/09/2024  . Hepatitis C Screening  Completed  . HIV Screening  Completed   Immunization History  Administered Date(s) Administered  . PPD Test 03/09/2014, 03/13/2015, 03/21/2016  . Tdap 03/09/2014  . Zoster 08/26/2014   Past Surgical History:  Procedure Laterality Date  . BREAST LUMPECTOMY WITH RADIOACTIVE SEED LOCALIZATION Right 02/25/2017   Procedure: RIGHT BREAST LUMPECTOMY WITH RADIOACTIVE SEED LOCALIZATION;  Surgeon: Rolm Bookbinder, MD;  Location: [facility];  Service: General;  Laterality: Right;  .  LASIK  1990  . LASIK  1990   Family History  Problem Relation Age of Onset  . Cancer Mother 34       lung  . Dementia Father   . Cancer Sister 41       colon  . Cancer Brother 19       colon   Social History  Substance Use Topics  . Smoking status: Former Smoker    Quit date: 11/04/1982  . Smokeless tobacco: Never Used  . Alcohol use 2.4 oz/week    4 Glasses of wine per week     Comment: social    ROS Constitutional: Denies fever, chills, weight loss/gain, headaches, insomnia,  night sweats, and change in appetite. Does c/o fatigue. Eyes: Denies redness, blurred vision, diplopia, discharge, itchy, watery eyes.  ENT: Denies discharge, congestion, post  nasal drip, epistaxis, sore throat, earache, hearing loss, dental pain, Tinnitus, Vertigo, Sinus pain, snoring.  Cardio: Denies chest pain, palpitations, irregular heartbeat, syncope, dyspnea, diaphoresis, orthopnea, PND, claudication, edema Respiratory: denies cough, dyspnea, DOE, pleurisy, hoarseness, laryngitis, wheezing.  Gastrointestinal: Denies dysphagia, heartburn, reflux, water brash, pain, cramps, nausea, vomiting, bloating, diarrhea, constipation, hematemesis, melena, hematochezia, jaundice, hemorrhoids Genitourinary: Denies dysuria, frequency, urgency, nocturia, hesitancy, discharge, hematuria, flank pain Breast: Breast lumps, nipple discharge, bleeding.  Musculoskeletal: Denies arthralgia, myalgia, stiffness, Jt. Swelling, pain, limp, and strain/sprain. Denies falls. Skin: Denies puritis, rash, hives, warts, acne, eczema, changing in skin lesion Neuro: No weakness, tremor, incoordination, spasms, paresthesia, pain Psychiatric: Denies confusion, memory loss, sensory loss. Denies Depression. Endocrine: Denies change in weight, skin, hair change, nocturia, and paresthesia, diabetic polys, visual blurring, hyper / hypo glycemic episodes.  Heme/Lymph: No excessive bleeding, bruising, enlarged lymph nodes.  Physical Exam  BP 108/62   Pulse 63   Temp 97.7 F (36.5 C)   Resp 16   Ht 5\' 6"  (1.676 m)   Wt 138 lb 9.6 oz (62.9 kg)   BMI 22.37 kg/m   General Appearance: Well nourished, well groomed and in no apparent distress.  Eyes: PERRLA, EOMs, conjunctiva no swelling or erythema, normal fundi and vessels. Sinuses: No frontal/maxillary tenderness ENT/Mouth: EACs patent / TMs  nl. Nares clear without erythema, swelling, mucoid exudates. Oral hygiene is good. No erythema, swelling, or exudate. Tongue normal, non-obstructing. Tonsils not swollen or erythematous. Hearing normal.  Neck: Supple, thyroid normal. No bruits, nodes or JVD. Respiratory: Respiratory effort normal.  BS equal and  clear bilateral without rales, rhonci, wheezing or stridor. Cardio: Heart sounds are normal with regular rate and rhythm and no murmurs, rubs or gallops. Peripheral pulses are normal and equal bilaterally without edema. No aortic or femoral bruits. Chest: symmetric with normal excursions and percussion. Breasts: Deferred to recent MGM's & bx's.   Abdomen: Flat, soft with bowel sounds active. Nontender, no guarding, rebound, hernias, masses, or organomegaly.  Lymphatics: Non tender without lymphadenopathy.  Musculoskeletal: Full ROM all peripheral extremities, joint stability, 5/5 strength, and normal gait. Skin: Warm and dry without rashes, lesions, cyanosis, clubbing or  ecchymosis.  Neuro: Cranial nerves intact, reflexes equal bilaterally. Normal muscle tone, no cerebellar symptoms. Sensation intact.  Pysch: Alert and oriented X 3, normal affect, Insight and Judgment appropriate.   Assessment and Plan  1. Annual Preventative Screening Examination  1. Encounter for general adult medical examination with abnormal findings   2. Labile hypertension  - EKG 12-Lead - [place], RETROPERITNL ABD,  LTD - Urinalysis, Routine w reflex microscopic - Microalbumin / creatinine urine ratio - CBC with Differential/Platelet -  BASIC METABOLIC PANEL WITH GFR - Magnesium - TSH  3. Hyperlipidemia, mixed  - EKG 12-Lead - [place], RETROPERITNL ABD,  LTD - Hepatic function panel - Lipid panel - TSH  4. Prediabetes  - EKG 12-Lead - [place], RETROPERITNL ABD,  LTD - Hemoglobin A1c - Insulin, random  5. Vitamin D deficiency  - VITAMIN D 25 Hydroxy   6. Screening for ischemic heart disease  - EKG 12-Lead  7. Screening for AAA (aortic abdominal aneurysm)  - [place], RETROPERITNL ABD,  LTD  8. Screening for colorectal cancer  - POC Hemoccult Bld/Stl   9. Fatigue  - Vitamin B12 - Iron and TIBC - CBC with Differential/Platelet  10. Medication management  - Urinalysis, Routine w reflex microscopic -  Microalbumin / creatinine urine ratio - CBC with Differential/Platelet - BASIC METABOLIC PANEL WITH GFR - Magnesium - Lipid panel - TSH - Hemoglobin A1c - Insulin, random - VITAMIN D 25 Hydroxy   11. Screening examination for pulmonary tuberculosis  - PPD      Patient was counseled in prudent diet to achieve/maintain BMI less than 25 for weight control, BP monitoring, regular exercise and medications. Discussed med's effects and SE's. Screening labs and tests as requested with regular follow-up as recommended. Over 40 minutes of exam, counseling, chart review and high complex critical decision making was performed.

## 2017-04-22 NOTE — Patient Instructions (Signed)

## 2017-04-23 ENCOUNTER — Other Ambulatory Visit: Payer: Self-pay | Admitting: Internal Medicine

## 2017-04-23 DIAGNOSIS — E0590 Thyrotoxicosis, unspecified without thyrotoxic crisis or storm: Secondary | ICD-10-CM

## 2017-04-23 LAB — VITAMIN B12: Vitamin B-12: 458 pg/mL (ref 200–1100)

## 2017-04-23 LAB — URINALYSIS, MICROSCOPIC ONLY
BACTERIA UA: NONE SEEN [HPF]
CRYSTALS: NONE SEEN [HPF]
Casts: NONE SEEN [LPF]
Yeast: NONE SEEN [HPF]

## 2017-04-23 LAB — URINALYSIS, ROUTINE W REFLEX MICROSCOPIC
BILIRUBIN URINE: NEGATIVE
Glucose, UA: NEGATIVE
Hgb urine dipstick: NEGATIVE
KETONES UR: NEGATIVE
Nitrite: NEGATIVE
PH: 6 (ref 5.0–8.0)
Protein, ur: NEGATIVE
SPECIFIC GRAVITY, URINE: 1.017 (ref 1.001–1.035)

## 2017-04-23 LAB — MICROALBUMIN / CREATININE URINE RATIO
CREATININE, URINE: 87 mg/dL (ref 20–320)
Microalb Creat Ratio: 3 mcg/mg creat (ref <30)
Microalb, Ur: 0.3 mg/dL

## 2017-04-23 LAB — VITAMIN D 25 HYDROXY (VIT D DEFICIENCY, FRACTURES): VIT D 25 HYDROXY: 100 ng/mL (ref 30–100)

## 2017-04-23 LAB — MAGNESIUM: MAGNESIUM: 2 mg/dL (ref 1.5–2.5)

## 2017-04-23 LAB — HEMOGLOBIN A1C
Hgb A1c MFr Bld: 5.3 % (ref <5.7)
MEAN PLASMA GLUCOSE: 105 mg/dL

## 2017-04-23 LAB — INSULIN, RANDOM: INSULIN: 4.8 u[IU]/mL (ref 2.0–19.6)

## 2017-04-28 ENCOUNTER — Encounter: Payer: Self-pay | Admitting: Internal Medicine

## 2017-04-28 LAB — TB SKIN TEST
INDURATION: 0 mm
TB SKIN TEST: NEGATIVE

## 2017-06-11 ENCOUNTER — Telehealth: Payer: Self-pay | Admitting: *Deleted

## 2017-06-11 NOTE — Telephone Encounter (Signed)
-----   Message from Unk Pinto, MD sent at 06/11/2017 10:28 AM EDT ----- Regarding: overdue lab  Lauren Fry - she's due fir f/u lab - her last TSH was in hyperthyroid range - and can cause osteoporosis , heart issues, etc  ----- Message ----- From: SYSTEM Sent: 06/11/2017  12:06 AM To: Unk Pinto, MD

## 2017-06-11 NOTE — Telephone Encounter (Signed)
Patient' VM full and could leave a message to schedule a lab only to recheck her TSH.

## 2017-06-17 ENCOUNTER — Telehealth: Payer: Self-pay | Admitting: *Deleted

## 2017-06-17 NOTE — Telephone Encounter (Signed)
-----   Message from Unk Pinto, MD sent at 06/11/2017 10:28 AM EDT ----- Regarding: overdue lab  Lauren Fry - she's due fir f/u lab - her last TSH was in hyperthyroid range - and can cause osteoporosis , heart issues, etc  ----- Message ----- From: SYSTEM Sent: 06/11/2017  12:06 AM To: Unk Pinto, MD

## 2017-06-17 NOTE — Telephone Encounter (Signed)
Patient scheduled a lab appointment to recheck TSH.

## 2017-06-23 ENCOUNTER — Other Ambulatory Visit: Payer: BLUE CROSS/BLUE SHIELD

## 2017-06-23 DIAGNOSIS — R0989 Other specified symptoms and signs involving the circulatory and respiratory systems: Secondary | ICD-10-CM | POA: Diagnosis not present

## 2017-06-23 DIAGNOSIS — R5383 Other fatigue: Secondary | ICD-10-CM | POA: Diagnosis not present

## 2017-06-23 DIAGNOSIS — E0590 Thyrotoxicosis, unspecified without thyrotoxic crisis or storm: Secondary | ICD-10-CM

## 2017-06-23 DIAGNOSIS — Z1211 Encounter for screening for malignant neoplasm of colon: Secondary | ICD-10-CM | POA: Diagnosis not present

## 2017-06-23 LAB — TSH: TSH: 5.84 m[IU]/L — AB (ref 0.40–4.50)

## 2017-06-24 ENCOUNTER — Other Ambulatory Visit: Payer: Self-pay | Admitting: Internal Medicine

## 2017-06-24 DIAGNOSIS — E069 Thyroiditis, unspecified: Secondary | ICD-10-CM

## 2017-07-02 ENCOUNTER — Other Ambulatory Visit: Payer: Self-pay

## 2017-07-02 DIAGNOSIS — Z1212 Encounter for screening for malignant neoplasm of rectum: Principal | ICD-10-CM

## 2017-07-02 DIAGNOSIS — Z1211 Encounter for screening for malignant neoplasm of colon: Secondary | ICD-10-CM

## 2017-07-02 LAB — POC HEMOCCULT BLD/STL (HOME/3-CARD/SCREEN)
Card #3 Fecal Occult Blood, POC: NEGATIVE
FECAL OCCULT BLD: NEGATIVE
FECAL OCCULT BLD: NEGATIVE

## 2017-11-11 ENCOUNTER — Other Ambulatory Visit (HOSPITAL_COMMUNITY)
Admission: RE | Admit: 2017-11-11 | Discharge: 2017-11-11 | Disposition: A | Discharge status: Home / Self Care | Payer: BLUE CROSS/BLUE SHIELD | Source: Ambulatory Visit | Attending: Obstetrics and Gynecology | Admitting: Obstetrics and Gynecology

## 2017-11-11 ENCOUNTER — Other Ambulatory Visit: Payer: Self-pay | Admitting: Obstetrics and Gynecology

## 2017-11-11 DIAGNOSIS — Z124 Encounter for screening for malignant neoplasm of cervix: Secondary | ICD-10-CM | POA: Diagnosis present

## 2017-11-13 LAB — CYTOLOGY - PAP
DIAGNOSIS: NEGATIVE
HPV (WINDOPATH): DETECTED — AB

## 2018-03-30 ENCOUNTER — Other Ambulatory Visit: Payer: Self-pay | Admitting: Internal Medicine

## 2018-04-27 IMAGING — MG STEREOTACTIC VACUUM ASSIST RIGHT
8 of 10 series · 8 of 10 positions shown · non-contrast
Comparison: Previous exams.

ADDENDUM:
Pathology revealed FIBROCYSTIC CHANGES WITH ADENOSIS AND
CALCIFICATIONS, PSEUDOANGIOMATOUS STROMAL HYPERPLASIA (PASH) of the
Right breast, upper outer quadrant. This was found to be discordant
by Dr. Vaivai Tiens, with excision recommended. Pathology results
were discussed with the patient by telephone. The patient reported
doing well after the biopsy with tenderness at the site. Post biopsy
instructions and care were reviewed and questions were answered. The
patient was encouraged to call [[email or web address]] for any additional concerns. Surgical consultation has been
arranged with Dr. Mesfin Molinar, per patient request, at [[email or web address]] on December 31, 2016.

Pathology results reported by Freddie Betty Belbru, RN on 12/17/2016.
CLINICAL DATA: Patient with right breast distortion presents today
for stereotactic biopsy with 3D tomosynthesis guidance.
EXAM:
RIGHT BREAST STEREOTACTIC CORE NEEDLE BIOPSY

[R (1 of 8)]
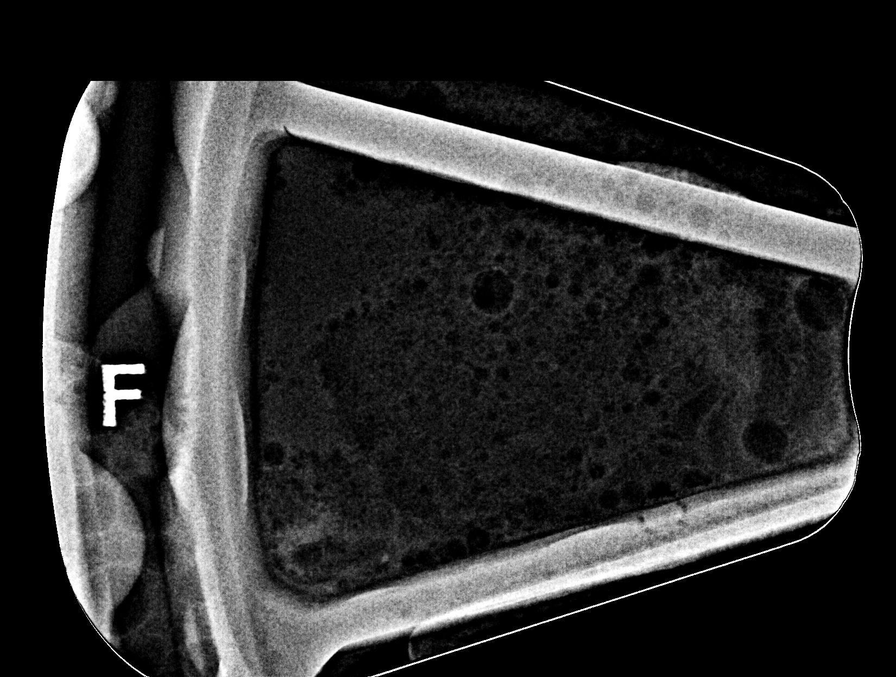

[R (2 of 8)]
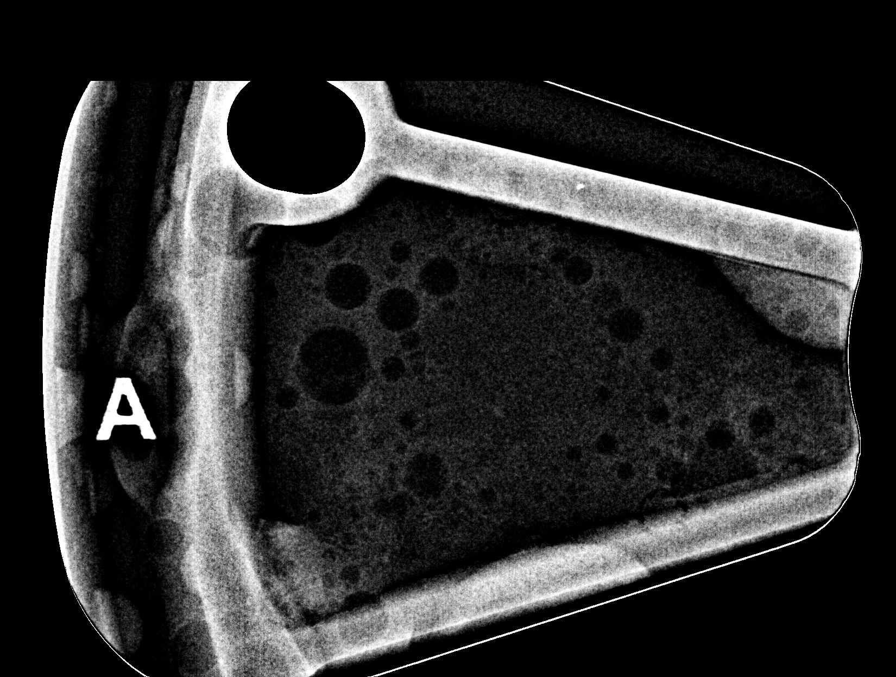

[R (3 of 8)]
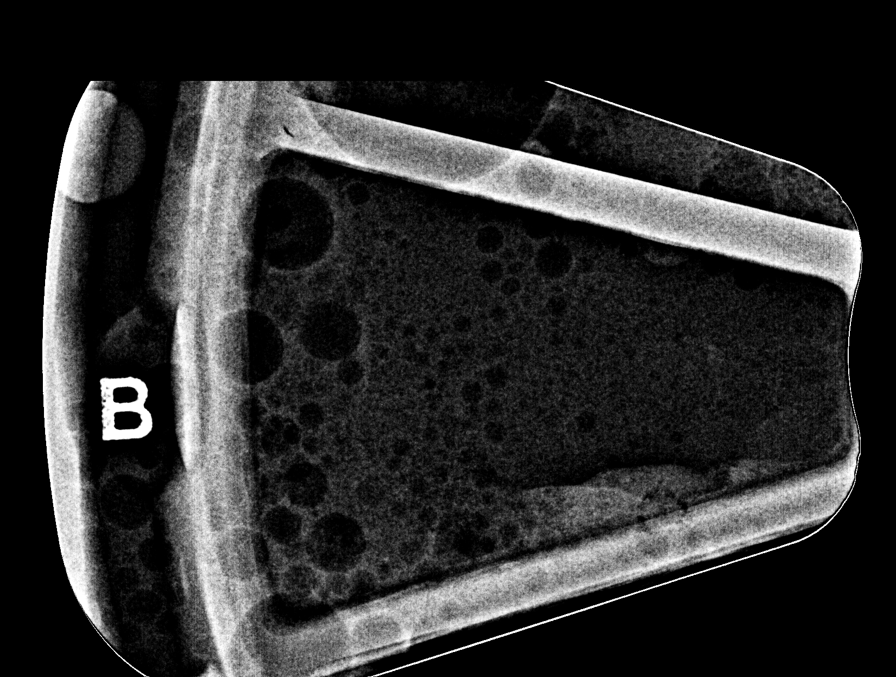

[R (4 of 8)]
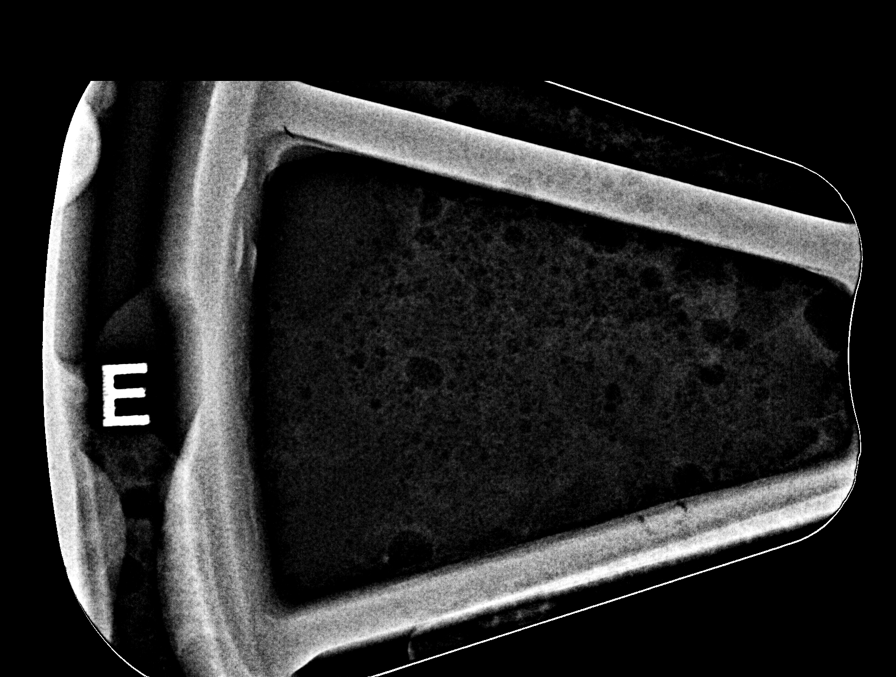

[R (5 of 8)]
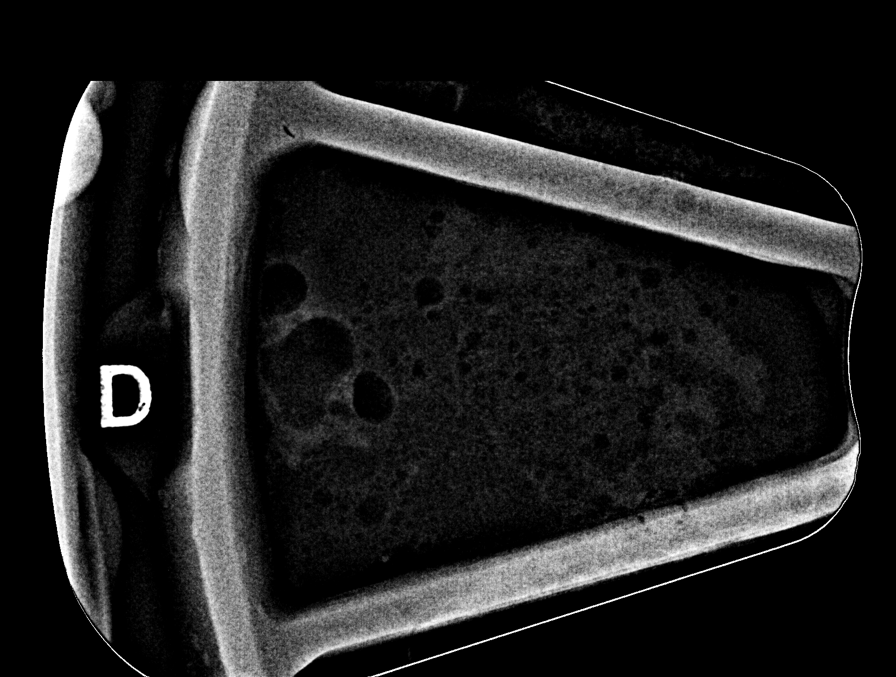

[R (6 of 8)]
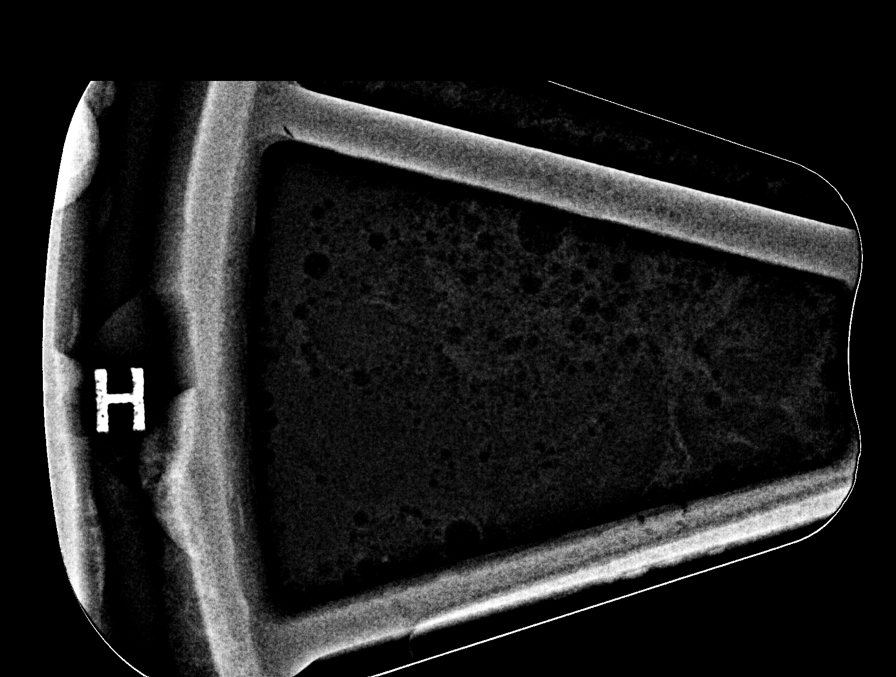

[R (7 of 8)]
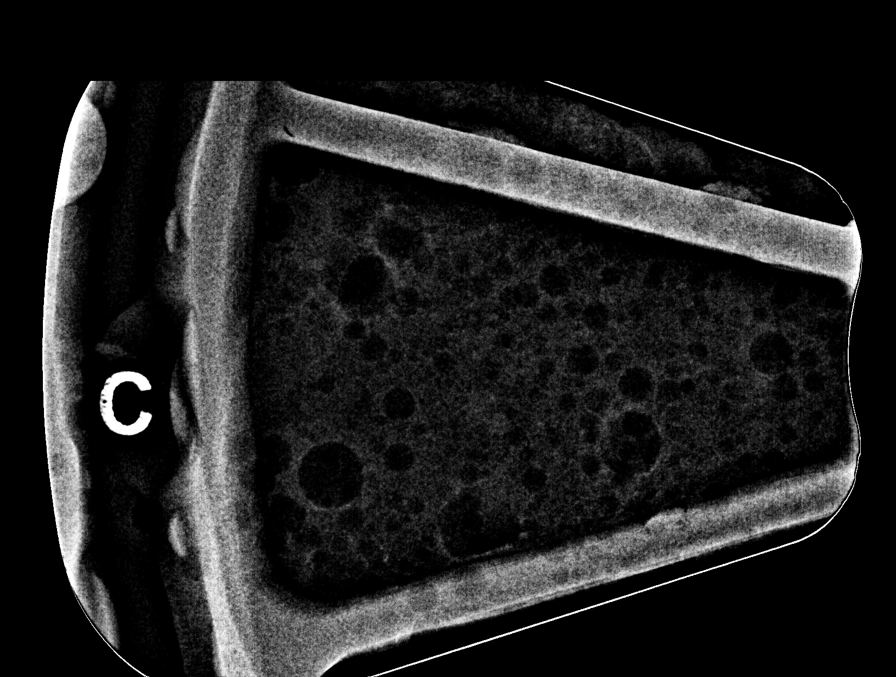

[R (8 of 8)]
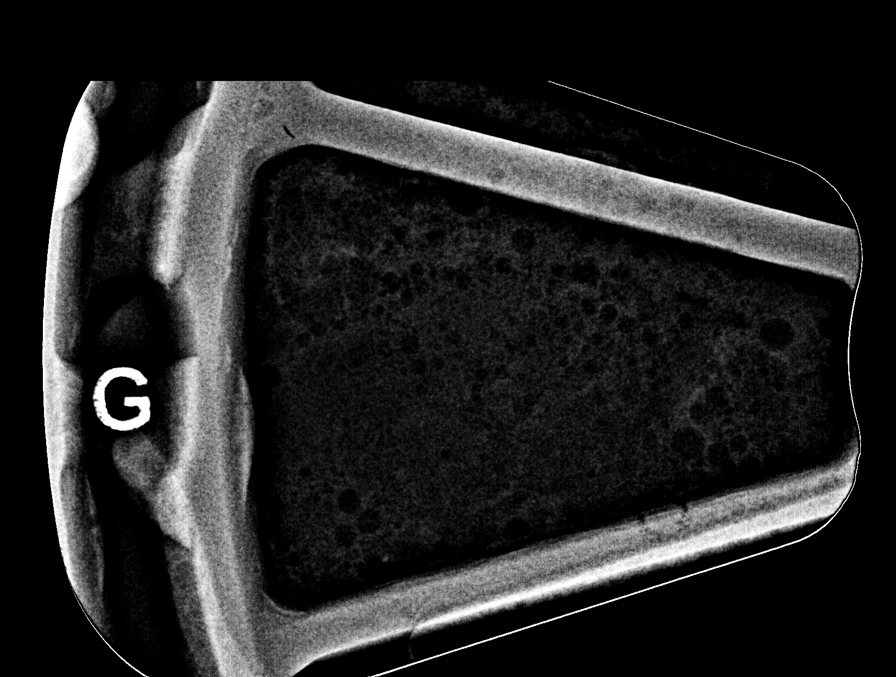

[8 of 10 positions shown; findings below may reference images not displayed]



Using sterile technique and 1% Lidocaine as local anesthetic, under
stereotactic guidance, a 9 gauge vacuum assisted device was used to
perform core needle biopsy of distortion within the upper-outer
quadrant of the right breast using a lateral approach.

At the conclusion of the procedure, a X shaped tissue marker clip
was deployed into the biopsy cavity. Follow-up 2-view mammogram was
performed and dictated separately.
IMPRESSION: Stereotactic-guided biopsy of distortion within the upper-outer
quadrant of the right breast. No apparent complications.

## 2018-04-29 LAB — HM COLONOSCOPY

## 2018-05-04 ENCOUNTER — Encounter: Payer: Self-pay | Admitting: Internal Medicine

## 2018-05-18 ENCOUNTER — Encounter: Payer: Self-pay | Admitting: *Deleted

## 2018-05-22 ENCOUNTER — Encounter: Payer: Self-pay | Admitting: Internal Medicine

## 2018-07-07 ENCOUNTER — Other Ambulatory Visit: Payer: Self-pay | Admitting: Internal Medicine

## 2018-07-07 DIAGNOSIS — K120 Recurrent oral aphthae: Secondary | ICD-10-CM

## 2018-07-16 ENCOUNTER — Ambulatory Visit (INDEPENDENT_AMBULATORY_CARE_PROVIDER_SITE_OTHER): Payer: BLUE CROSS/BLUE SHIELD | Admitting: Internal Medicine

## 2018-07-16 ENCOUNTER — Encounter: Payer: Self-pay | Admitting: Internal Medicine

## 2018-07-16 VITALS — BP 104/64 | HR 60 | Temp 97.9°F | Resp 16 | Ht 67.0 in | Wt 144.4 lb

## 2018-07-16 DIAGNOSIS — Z1211 Encounter for screening for malignant neoplasm of colon: Secondary | ICD-10-CM

## 2018-07-16 DIAGNOSIS — R0989 Other specified symptoms and signs involving the circulatory and respiratory systems: Secondary | ICD-10-CM

## 2018-07-16 DIAGNOSIS — Z0000 Encounter for general adult medical examination without abnormal findings: Secondary | ICD-10-CM | POA: Diagnosis not present

## 2018-07-16 DIAGNOSIS — I10 Essential (primary) hypertension: Secondary | ICD-10-CM

## 2018-07-16 DIAGNOSIS — E782 Mixed hyperlipidemia: Secondary | ICD-10-CM

## 2018-07-16 DIAGNOSIS — Z130 Encounter for screening for diseases of the blood and blood-forming organs and certain disorders involving the immune mechanism: Secondary | ICD-10-CM

## 2018-07-16 DIAGNOSIS — Z1389 Encounter for screening for other disorder: Secondary | ICD-10-CM | POA: Diagnosis not present

## 2018-07-16 DIAGNOSIS — Z87891 Personal history of nicotine dependence: Secondary | ICD-10-CM

## 2018-07-16 DIAGNOSIS — Z1212 Encounter for screening for malignant neoplasm of rectum: Secondary | ICD-10-CM

## 2018-07-16 DIAGNOSIS — R5383 Other fatigue: Secondary | ICD-10-CM

## 2018-07-16 DIAGNOSIS — E559 Vitamin D deficiency, unspecified: Secondary | ICD-10-CM

## 2018-07-16 DIAGNOSIS — Z0001 Encounter for general adult medical examination with abnormal findings: Secondary | ICD-10-CM

## 2018-07-16 DIAGNOSIS — Z13220 Encounter for screening for lipoid disorders: Secondary | ICD-10-CM | POA: Diagnosis not present

## 2018-07-16 DIAGNOSIS — Z136 Encounter for screening for cardiovascular disorders: Secondary | ICD-10-CM | POA: Diagnosis not present

## 2018-07-16 DIAGNOSIS — Z79899 Other long term (current) drug therapy: Secondary | ICD-10-CM

## 2018-07-16 DIAGNOSIS — Z1329 Encounter for screening for other suspected endocrine disorder: Secondary | ICD-10-CM | POA: Diagnosis not present

## 2018-07-16 DIAGNOSIS — Z111 Encounter for screening for respiratory tuberculosis: Secondary | ICD-10-CM | POA: Diagnosis not present

## 2018-07-16 DIAGNOSIS — Z131 Encounter for screening for diabetes mellitus: Secondary | ICD-10-CM

## 2018-07-16 DIAGNOSIS — R7303 Prediabetes: Secondary | ICD-10-CM

## 2018-07-16 NOTE — Patient Instructions (Signed)

## 2018-07-16 NOTE — Progress Notes (Signed)
[facility] Lauren Fry, M.D.     Lauren Fry. Lauren Fry, P.A.-C Lauren Fry, [name], [name] Telephone [phone] Telefax [phone] Annual Screening/Preventative Visit & Comprehensive Evaluation &  Examination     This very nice 62 y.o. MWF  presents for a Screening /Preventative Visit & comprehensive evaluation and management of multiple medical co-morbidities.  Patient has been followed expectantly for labile BP, elev Chol, elev blood sugar  and Vitamin D Deficiency.     Patient is monitored expectantly for labile elevated BP which  has been controlled and patient denies any cardiac symptoms as chest pain, palpitations, shortness of breath, dizziness or ankle swelling. Today's BP is at goal - 104/64.      Patient's hyperlipidemia is not controlled with diet. Last lipids were near goal: Lab Results  Component Value Date   CHOL 208 (H) 07/16/2018   HDL 52 07/16/2018   LDLCALC 130 (H) 07/16/2018   TRIG 144 07/16/2018   CHOLHDL 4.0 07/16/2018      Patient has hx/o prediabetes (A1c 5.7%/2011 & 5.6%/2016)  and patient denies reactive hypoglycemic symptoms, visual blurring, diabetic polys, or paresthesias. Last A1c was Normal & at goal: Lab Results  Component Value Date   HGBA1C 5.4 07/16/2018      Finally, patient has history of Vitamin D Deficiency and last Vitamin D was  Lab Results  Component Value Date   VD25OH 77 07/16/2018   Current Outpatient Medications on File Prior to Visit  Medication Sig  . aspirin 81 MG chewable tablet Chew 81 mg by mouth daily.  . Cholecalciferol (VITAMIN D PO) Take 5,000 Units by mouth.  . citalopram (CELEXA) 40 MG tablet TAKE 1 TABLET DAILY FOR MOOD  . famciclovir (FAMVIR) 500 MG tablet TAKE 1 TABLET TWICE A DAY  . Magnesium 250 MG TABS Take 250 mg by mouth daily.   . Milk Thistle 250 MG CAPS Take 1 capsule (250 mg total) by mouth  daily.  . Turmeric, Curcuma Longa, (CURCUMIN) POWD by Does not apply route. Takes the Curcumin in a capsule-OTC.   No current facility-administered medications on file prior to visit.    No Known Allergies Past Medical History:  Diagnosis Date  . Anxiety   . Breast mass, right   . HSV-2 (herpes simplex virus 2) infection   . Hyperlipidemia   . Pre-diabetes   . Unspecified vitamin D deficiency   . Varicose veins    Health Maintenance  Topic Date Due  . MAMMOGRAM  12/06/2018  . PAP SMEAR  11/11/2020  . TETANUS/TDAP  03/09/2024  . COLONOSCOPY  04/29/2028  . INFLUENZA VACCINE  Completed  . Hepatitis C Screening  Completed  . HIV Screening  Completed   Immunization History  Administered Date(s) Administered  . Influenza-Unspecified 07/06/2018  . PPD Test 03/09/2014, 03/13/2015, 03/21/2016, 04/22/2017, 07/16/2018  . Tdap 03/09/2014  . Zoster 08/26/2014   Last Colon - 04/29/2018 - Dr Watt Climes - recc 10 yr f/u  Last MGM - 12/2016 &  Past Surgical History:  Procedure Laterality Date  . BREAST LUMPECTOMY WITH RADIOACTIVE SEED LOCALIZATION Right 02/25/2017   Procedure: RIGHT BREAST LUMPECTOMY WITH RADIOACTIVE SEED LOCALIZATION;  Surgeon: Rolm Bookbinder, MD;  Location: [facility];  Service: General;  Laterality: Right;  . LASIK  1990  . LASIK  1990   Family History  Problem Relation Age of Onset  . Cancer Mother 64  lung  . Dementia Father   . Cancer Sister 67       colon  . Cancer Brother 1       colon   Social History   Tobacco Use  . Smoking status: Former Smoker    Last attempt to quit: 11/04/1982    Years since quitting: 35.7  . Smokeless tobacco: Never Used  Substance Use Topics  . Alcohol use: Yes    Alcohol/week: 4.0 standard drinks    Types: 4 Glasses of wine per week    Comment: social  . Drug use: No    ROS Constitutional: Denies fever, chills, weight loss/gain, headaches, insomnia,  night sweats, and change in appetite. Does c/o  fatigue. Eyes: Denies redness, blurred vision, diplopia, discharge, itchy, watery eyes.  ENT: Denies discharge, congestion, post nasal drip, epistaxis, sore throat, earache, hearing loss, dental pain, Tinnitus, Vertigo, Sinus pain, snoring.  Cardio: Denies chest pain, palpitations, irregular heartbeat, syncope, dyspnea, diaphoresis, orthopnea, PND, claudication, edema Respiratory: denies cough, dyspnea, DOE, pleurisy, hoarseness, laryngitis, wheezing.  Gastrointestinal: Denies dysphagia, heartburn, reflux, water brash, pain, cramps, nausea, vomiting, bloating, diarrhea, constipation, hematemesis, melena, hematochezia, jaundice, hemorrhoids Genitourinary: Denies dysuria, frequency, urgency, nocturia, hesitancy, discharge, hematuria, flank pain Breast: Breast lumps, nipple discharge, bleeding.  Musculoskeletal: Denies arthralgia, myalgia, stiffness, Jt. Swelling, pain, limp, and strain/sprain. Denies falls. Skin: Denies puritis, rash, hives, warts, acne, eczema, changing in skin lesion Neuro: No weakness, tremor, incoordination, spasms, paresthesia, pain Psychiatric: Denies confusion, memory loss, sensory loss. Denies Depression. Endocrine: Denies change in weight, skin, hair change, nocturia, and paresthesia, diabetic polys, visual blurring, hyper / hypo glycemic episodes.  Heme/Lymph: No excessive bleeding, bruising, enlarged lymph nodes.  Physical Exam  BP 104/64   Pulse 60   Temp 97.9 F (36.6 C)   Resp 16   Ht 5\' 7"  (1.702 m)   Wt 144 lb 6.4 oz (65.5 kg)   BMI 22.62 kg/m   General Appearance: Well nourished, well groomed and in no apparent distress.  Eyes: PERRLA, EOMs, conjunctiva no swelling or erythema, normal fundi and vessels. Sinuses: No frontal/maxillary tenderness ENT/Mouth: EACs patent / TMs  nl. Nares clear without erythema, swelling, mucoid exudates. Oral hygiene is good. No erythema, swelling, or exudate. Tongue normal, non-obstructing. Tonsils not swollen or  erythematous. Hearing normal.  Neck: Supple, thyroid not palpable. No bruits, nodes or JVD. Respiratory: Respiratory effort normal.  BS equal and clear bilateral without rales, rhonci, wheezing or stridor. Cardio: Heart sounds are normal with regular rate and rhythm and no murmurs, rubs or gallops. Peripheral pulses are normal and equal bilaterally without edema. No aortic or femoral bruits. Chest: symmetric with normal excursions and percussion. Breasts: Symmetric, without lumps, nipple discharge, retractions, or fibrocystic changes.  Abdomen: Flat, soft with bowel sounds active. Nontender, no guarding, rebound, hernias, masses, or organomegaly.  Lymphatics: Non tender without lymphadenopathy.  Musculoskeletal: Full ROM all peripheral extremities, joint stability, 5/5 strength, and normal gait. Skin: Warm and dry without rashes, lesions, cyanosis, clubbing or  ecchymosis.  Neuro: Cranial nerves intact, reflexes equal bilaterally. Normal muscle tone, no cerebellar symptoms. Sensation intact.  Pysch: Alert and oriented X 3, normal affect, Insight and Judgment appropriate.   Assessment and Plan  1. Annual Preventative Screening Examination  2. Labile hypertension  - EKG 12-Lead - [place], RETROPERITNL ABD,  LTD - Urinalysis, Routine w reflex microscopic - Microalbumin / creatinine urine ratio - CBC with Differential/Platelet - COMPLETE METABOLIC PANEL WITH GFR - Magnesium - TSH  3. Hyperlipidemia, mixed  -  EKG 12-Lead - [place], RETROPERITNL ABD,  LTD - Lipid panel - TSH  4. Prediabetes  - EKG 12-Lead - [place], RETROPERITNL ABD,  LTD - Insulin, random  5. Vitamin D deficiency  - VITAMIN D 25 Hydroxyl  6. Screening for colorectal cancer  - POC Hemoccult Bld/Stl  7. Screening for ischemic heart disease  - EKG 12-Lead  8. Former smoker  - EKG 12-Lead - [place], RETROPERITNL ABD,  LTD  9. Screening for AAA (aortic abdominal aneurysm)  - [place], RETROPERITNL ABD,  LTD  10. Screening  examination for pulmonary tuberculosis  - PPD  11. Fatigue  - Iron,Total/Total Iron Binding Cap - Vitamin B12 - CBC with Differential/Platelet - TSH  12. Medication management  - Urinalysis, Routine w reflex microscopic - CBC with Differential/Platelet - COMPLETE METABOLIC PANEL WITH GFR - Magnesium - Lipid panel - TSH - Hemoglobin A1c - Insulin, random - VITAMIN D 25 Hydroxyl        Patient was counseled in prudent diet to achieve/maintain BMI less than 25 for weight control, BP monitoring, regular exercise and medications. Discussed med's effects and SE's. Screening labs and tests as requested with regular follow-up as recommended. Over 40 minutes of exam, counseling, chart review and high complex critical decision making was performed.

## 2018-07-17 LAB — LIPID PANEL
Cholesterol: 208 mg/dL — ABNORMAL HIGH (ref <200)
HDL: 52 mg/dL (ref >50)
LDL Cholesterol (Calc): 130 mg/dL (calc) — ABNORMAL HIGH
NON-HDL CHOLESTEROL (CALC): 156 mg/dL — AB (ref <130)
TRIGLYCERIDES: 144 mg/dL (ref <150)
Total CHOL/HDL Ratio: 4 (calc) (ref <5.0)

## 2018-07-17 LAB — COMPLETE METABOLIC PANEL WITH GFR
AG RATIO: 1.8 (calc) (ref 1.0–2.5)
ALKALINE PHOSPHATASE (APISO): 55 U/L (ref 33–130)
ALT: 13 U/L (ref 6–29)
AST: 21 U/L (ref 10–35)
Albumin: 4.2 g/dL (ref 3.6–5.1)
BILIRUBIN TOTAL: 0.4 mg/dL (ref 0.2–1.2)
BUN/Creatinine Ratio: 15 (calc) (ref 6–22)
BUN: 15 mg/dL (ref 7–25)
CHLORIDE: 104 mmol/L (ref 98–110)
CO2: 28 mmol/L (ref 20–32)
Calcium: 9.3 mg/dL (ref 8.6–10.4)
Creat: 1 mg/dL — ABNORMAL HIGH (ref 0.50–0.99)
GFR, EST AFRICAN AMERICAN: 69 mL/min/{1.73_m2} (ref >=60)
GFR, Est Non African American: 60 mL/min/{1.73_m2} (ref >=60)
GLUCOSE: 88 mg/dL (ref 65–99)
Globulin: 2.3 g/dL (calc) (ref 1.9–3.7)
POTASSIUM: 4.4 mmol/L (ref 3.5–5.3)
Sodium: 140 mmol/L (ref 135–146)
TOTAL PROTEIN: 6.5 g/dL (ref 6.1–8.1)

## 2018-07-17 LAB — URINALYSIS, ROUTINE W REFLEX MICROSCOPIC
BACTERIA UA: NONE SEEN /HPF
Bilirubin Urine: NEGATIVE
GLUCOSE, UA: NEGATIVE
HYALINE CAST: NONE SEEN /LPF
Hgb urine dipstick: NEGATIVE
Ketones, ur: NEGATIVE
Nitrite: NEGATIVE
PH: 5.5 (ref 5.0–8.0)
Protein, ur: NEGATIVE
RBC / HPF: NONE SEEN /HPF (ref 0–2)
Specific Gravity, Urine: 1.012 (ref 1.001–1.03)

## 2018-07-17 LAB — CBC WITH DIFFERENTIAL/PLATELET
BASOS ABS: 38 {cells}/uL (ref 0–200)
BASOS PCT: 0.7 %
EOS ABS: 70 {cells}/uL (ref 15–500)
Eosinophils Relative: 1.3 %
HEMATOCRIT: 39.4 % (ref 35.0–45.0)
HEMOGLOBIN: 13.3 g/dL (ref 11.7–15.5)
Lymphs Abs: 1728 cells/uL (ref 850–3900)
MCH: 31.4 pg (ref 27.0–33.0)
MCHC: 33.8 g/dL (ref 32.0–36.0)
MCV: 92.9 fL (ref 80.0–100.0)
MONOS PCT: 7.2 %
MPV: 10.9 fL (ref 7.5–12.5)
NEUTROS ABS: 3175 {cells}/uL (ref 1500–7800)
Neutrophils Relative %: 58.8 %
Platelets: 223 10*3/uL (ref 140–400)
RBC: 4.24 10*6/uL (ref 3.80–5.10)
RDW: 12.5 % (ref 11.0–15.0)
Total Lymphocyte: 32 %
WBC: 5.4 10*3/uL (ref 3.8–10.8)
WBCMIX: 389 {cells}/uL (ref 200–950)

## 2018-07-17 LAB — MICROALBUMIN / CREATININE URINE RATIO
Creatinine, Urine: 62 mg/dL (ref 20–275)
MICROALB UR: 0.3 mg/dL
MICROALB/CREAT RATIO: 5 ug/mg{creat} (ref <30)

## 2018-07-17 LAB — IRON, TOTAL/TOTAL IRON BINDING CAP
%SAT: 24 % (calc) (ref 16–45)
Iron: 88 ug/dL (ref 45–160)
TIBC: 365 ug/dL (ref 250–450)

## 2018-07-17 LAB — VITAMIN B12: Vitamin B-12: 454 pg/mL (ref 200–1100)

## 2018-07-17 LAB — HEMOGLOBIN A1C
EAG (MMOL/L): 6 (calc)
Hgb A1c MFr Bld: 5.4 % of total Hgb (ref <5.7)
MEAN PLASMA GLUCOSE: 108 (calc)

## 2018-07-17 LAB — MAGNESIUM: MAGNESIUM: 2.1 mg/dL (ref 1.5–2.5)

## 2018-07-17 LAB — VITAMIN D 25 HYDROXY (VIT D DEFICIENCY, FRACTURES): Vit D, 25-Hydroxy: 77 ng/mL (ref 30–100)

## 2018-07-17 LAB — TSH: TSH: 1.12 m[IU]/L (ref 0.40–4.50)

## 2018-07-17 LAB — INSULIN, RANDOM: INSULIN: 73.2 u[IU]/mL — AB (ref 2.0–19.6)

## 2018-07-19 ENCOUNTER — Encounter: Payer: Self-pay | Admitting: Internal Medicine

## 2018-08-27 ENCOUNTER — Other Ambulatory Visit: Payer: Self-pay | Admitting: Chiropractic Medicine

## 2018-08-27 ENCOUNTER — Ambulatory Visit
Admission: RE | Admit: 2018-08-27 | Discharge: 2018-08-27 | Disposition: A | Discharge status: Home / Self Care | Payer: BLUE CROSS/BLUE SHIELD | Source: Ambulatory Visit | Attending: Chiropractic Medicine | Admitting: Chiropractic Medicine

## 2018-08-27 DIAGNOSIS — M542 Cervicalgia: Secondary | ICD-10-CM

## 2018-11-02 ENCOUNTER — Other Ambulatory Visit: Payer: Self-pay | Admitting: Internal Medicine

## 2018-11-02 DIAGNOSIS — N644 Mastodynia: Secondary | ICD-10-CM

## 2018-12-04 ENCOUNTER — Other Ambulatory Visit: Payer: Self-pay | Admitting: Obstetrics and Gynecology

## 2018-12-04 ENCOUNTER — Other Ambulatory Visit (HOSPITAL_COMMUNITY)
Admission: RE | Admit: 2018-12-04 | Discharge: 2018-12-04 | Disposition: A | Discharge status: Home / Self Care | Payer: BLUE CROSS/BLUE SHIELD | Source: Ambulatory Visit | Attending: Obstetrics and Gynecology | Admitting: Obstetrics and Gynecology

## 2018-12-04 DIAGNOSIS — Z01419 Encounter for gynecological examination (general) (routine) without abnormal findings: Secondary | ICD-10-CM | POA: Insufficient documentation

## 2018-12-07 ENCOUNTER — Other Ambulatory Visit: Payer: Self-pay | Admitting: Obstetrics and Gynecology

## 2018-12-07 DIAGNOSIS — N6320 Unspecified lump in the left breast, unspecified quadrant: Secondary | ICD-10-CM

## 2018-12-07 LAB — CYTOLOGY - PAP
DIAGNOSIS: NEGATIVE
HPV 16/18/45 GENOTYPING: POSITIVE — AB
HPV: DETECTED — AB

## 2018-12-09 ENCOUNTER — Other Ambulatory Visit: Payer: Self-pay

## 2018-12-11 ENCOUNTER — Ambulatory Visit
Admission: RE | Admit: 2018-12-11 | Discharge: 2018-12-11 | Disposition: A | Discharge status: Home / Self Care | Payer: BLUE CROSS/BLUE SHIELD | Source: Ambulatory Visit | Attending: Obstetrics and Gynecology | Admitting: Obstetrics and Gynecology

## 2018-12-11 DIAGNOSIS — N6320 Unspecified lump in the left breast, unspecified quadrant: Secondary | ICD-10-CM

## 2019-01-05 ENCOUNTER — Other Ambulatory Visit: Payer: Self-pay | Admitting: Obstetrics and Gynecology

## 2019-01-29 ENCOUNTER — Encounter (HOSPITAL_BASED_OUTPATIENT_CLINIC_OR_DEPARTMENT_OTHER): Payer: Self-pay | Admitting: *Deleted

## 2019-01-29 ENCOUNTER — Other Ambulatory Visit: Payer: Self-pay

## 2019-02-01 ENCOUNTER — Other Ambulatory Visit: Payer: Self-pay | Admitting: Obstetrics and Gynecology

## 2019-02-01 NOTE — H&P (Signed)
Chief Complaint(s):   Severe Cervical Dysplasia CIN III  HPI:  General 63 y/o presents for LEEP procedure  She had a negative pap smear however high risk HPV was detected on 12/04/2018. Her high risk HPV for 11/2018 was positive for subtype 16. She also had a negative pap smear with high risk HPV detected on 11/13/2017. Due to high risk HPV being detected two consecutive years, she had a colposcopy performed 01/05/2019 is recommended. Colposcopy revealed high grade dysplasia at the 6 o'clcok position that was diagnosed as CIN II and CIN III. ECC was benign. Biopsy at 12 o'clock spolastic epitheleal. Current Medication: Taking Vitamin D (Cholecalciferol) 1 tablet daily. Famciclovir 500 MG Tablet 1 tablet Orally every 8 hrs. Citalopram Hydrobromide 20 MG Tablet 1 tablet Orally Once a day. Medication List reviewed and reconciled with the patient. Medical History:  osteoporosis     lung nodule, stable on CT 2/09 no need to further follow     family history colon cancer, [place]     varicose veins     hsv 2      Allergies/Intolerance:  N.K.D.A.   Gyn History:  Sexual activity currently sexually active. Periods : postmenopausal. LMP 2009 ish. Last pap smear date 12/04/2018-neg/HPV dect. Last mammogram date 12/11/2018. Denies Abnormal pap smear. H/O STD Herpes.   OB History:  Number of pregnancies 3. Pregnancy # 1 live birth, vaginal delivery, girl. Pregnancy # 2 live birth, vaginal delivery, girl. Pregnancy # 3 live birth, vaginal delivery, girl.   Surgical History:  Lasik eye surgery     colonoscopy 2008     colonoscopy 3/14     lumpectomy, right breast 01/2017     colonoscopy- sessile serrated polyps 04/2018   Hospitalization:  childbirth 44, 54, 4   Family History:  Father: deceased 2 yrs, dementia    Mother: deceased, lung cancer at 72, smoker    Paternal [place] Father: deceased, colon cancer    Paternal [place] Mother: deceased    Maternal Grand Father: deceased    Maternal Grand Mother: deceased    Brother 1: deceased, colon cancer, diagnosed with Colon cancer in 73    [place]: alive    Sister 1: deceased, colon cancer at 40, osteoporosis, liver cancer, Colon cancer in 5    Sister 2: alive, osteoporosis    Sister 3: alive    1 brother(s) , 5 sister(s) . 3daughter(s) .    Negative for liver disease- brother and sister with colon cancer.  Social History: General no EXPOSURE TO PASSIVE SMOKE.  Alcohol: yes, one to 2 glasses of wine 2 times a week.  Children: Lauralyn Primes.  Caffeine: yes, coffee.  Tobacco use cigarettes: Former smoker, Tobacco history last updated 01/29/2019.  Religion: [employer].  Marital Status: divorced 4/08.  no Recreational drug use.  OCCUPATION: employed, Catering manager.  Exercise: yes, 3-4 times per week.   ROS: CONSTITUTIONAL No" options="no,yes" propid="91" itemid="193425" categoryid="10464" encounterid="[id number]"Chills No. No" options="no,yes" propid="91" itemid="172899" categoryid="10464" encounterid="[id number]"Fatigue No. No" options="no,yes" propid="91" itemid="10467" categoryid="10464" encounterid="[id number]"Fever No. No" options="no,yes" propid="91" itemid="193426" categoryid="10464" encounterid="[id number]"Night sweats No. No" options="no,yes" propid="91" itemid="444261" categoryid="10464" encounterid="[id number]"Recent travel outside [place] No. No" options="no,yes" propid="91" itemid="193427" categoryid="10464" encounterid="[id number]"Sweats No. No" options="no,yes" propid="91" itemid="194825" categoryid="10464" encounterid="[id number]"Weight change No.  OPHTHALMOLOGY no" options="no,yes" propid="91" itemid="12520" categoryid="12516" encounterid="[id number]"Blurring of vision no. no" options="no,yes" propid="91" itemid="193469" categoryid="12516" encounterid="[id number]"Change in vision no. no" options="no,yes" propid="91" itemid="194379" categoryid="12516" encounterid="11343058"Double vision no.  ENT no"  options="no,yes" propid="91" itemid="193612" categoryid="10481" encounterid="[id number]"Dizziness no. Nose bleeds no. Sore throat no. Teeth pain no.  ALLERGY  no" options="no,yes" propid="91" itemid="202589" categoryid="138152" encounterid="11343058"Hives no.  CARDIOLOGY no" options="no,yes" propid="91" itemid="193603" categoryid="10488" encounterid="11343058"Chest pain no. no" options="no,yes" propid="91" itemid="199089" categoryid="10488" encounterid="11343058"High blood pressure no. no" options="no,yes" propid="91" itemid="202598" categoryid="10488" encounterid="11343058"Irregular heart beat no. no" options="no,yes" propid="91" itemid="10491" categoryid="10488" encounterid="11343058"Leg edema no. no" options="no,yes" propid="91" itemid="10490" categoryid="10488" encounterid="11343058"Palpitations no.  RESPIRATORY no" options="no" propid="91" itemid="270013" categoryid="138132" encounterid="11343058"Shortness of breath no. no" options="no,yes" propid="91" itemid="172745" categoryid="138132" encounterid="11343058"Cough no. no" options="no,yes" propid="91" itemid="193621" categoryid="138132" encounterid="11343058"Wheezing no.  UROLOGY no" options="no,yes" propid="91" [phone]" categoryid="138166" encounterid="11343058"Pain with urination no. no" options="no,yes" propid="91" itemid="193493" categoryid="138166" encounterid="11343058"Urinary urgency no. no" options="no,yes" propid="91" itemid="193492" categoryid="138166" encounterid="11343058"Urinary frequency no. no" options="no,yes" propid="91" itemid="138171" categoryid="138166" encounterid="11343058"Urinary incontinence no. No" options="no,yes" propid="91" itemid="138167" categoryid="138166" encounterid="11343058"Difficulty urinating No. No" options="no,yes" propid="91" itemid="138168" categoryid="138166" encounterid="11343058"Blood in urine No.  GASTROENTEROLOGY no" options="no,yes" propid="91" itemid="10496" categoryid="10494"  encounterid="11343058"Abdominal pain no. no" options="no,yes" propid="91" itemid="193447" categoryid="10494" encounterid="11343058"Appetite change no. no" options="no,yes" propid="91" itemid="193448" categoryid="10494" encounterid="11343058"Bloating/belching no. no" options="no,yes" propid="91" itemid="10503" categoryid="10494" encounterid="11343058"Blood in stool or on toilet paper no. no" options="no,yes" propid="91" itemid="199106" categoryid="10494" encounterid="11343058"Change in bowel movements no. no" options="no,yes" propid="91" itemid="10501" categoryid="10494" encounterid="11343058"Constipation no. no" options="no,yes" propid="91" itemid="10502" categoryid="10494" encounterid="11343058"Diarrhea no. no" options="no,yes" propid="91" itemid="199104" categoryid="10494" encounterid="11343058"Difficulty swallowing no. no" options="no,yes" propid="91" itemid="10499" categoryid="10494" encounterid="11343058"Nausea no.  FEMALE REPRODUCTIVE no" options="no,yes" propid="91" itemid="453725" categoryid="10525" encounterid="11343058"Vulvar pain no. no" options="no,yes" propid="91" itemid="453726" categoryid="10525" encounterid="11343058"Vulvar rash no. no" options="no, yes" propid="91" itemid="444315" categoryid="10525" encounterid="11343058"Abnormal vaginal bleeding no. no" options="no,yes" propid="91" itemid="186083" categoryid="10525" encounterid="11343058"Breast pain no. no" options="no,yes" propid="91" itemid="186084" categoryid="10525" encounterid="11343058"Nipple discharge no. no" options="no,yes" propid="91" itemid="275823" categoryid="10525" encounterid="11343058"Pain with intercourse no. no" options="no,yes" propid="91" itemid="186082" categoryid="10525" encounterid="11343058"Pelvic pain no. no" options="no,yes" propid="91" itemid="278230" categoryid="10525" encounterid="11343058"Unusual vaginal discharge no. no" options="no,yes" propid="91" itemid="278942" categoryid="10525" encounterid="11343058"Vaginal  itching no.  MUSCULOSKELETAL no" options="no,yes" propid="91" itemid="193461" categoryid="10514" encounterid="11343058"Muscle aches no.  NEUROLOGY no" options="no,yes" propid="91" itemid="12513" categoryid="12512" encounterid="11343058"Headache no. no" options="no,yes" propid="91" itemid="12514" categoryid="12512" encounterid="11343058"Tingling/numbness no. no" options="no,yes" propid="91" itemid="193468" categoryid="12512" encounterid="11343058"Weakness no.  PSYCHOLOGY no" options="" propid="91" itemid="275919" categoryid="10520" encounterid="11343058"Depression no. no" options="no,yes" propid="91" itemid="172748" categoryid="10520" encounterid="11343058"Anxiety no. no" options="no,yes" propid="91" itemid="199158" categoryid="10520" encounterid="11343058"Nervousness no. no" options="no,yes" propid="91" itemid="12502" categoryid="10520" encounterid="11343058"Sleep disturbances no. no " options="no,yes" propid="91" itemid="72718" categoryid="10520" encounterid="11343058"Suicidal ideation no .  ENDOCRINOLOGY no" options="no,yes" propid="91" itemid="194628" categoryid="12508" encounterid="11343058"Excessive thirst no. no" options="no,yes" propid="91" itemid="196285" categoryid="12508" encounterid="11343058"Excessive urination no. no" options="no, yes" propid="91" itemid="444314" categoryid="12508" encounterid="11343058"Hair loss no. no" options="" propid="91" itemid="447284" categoryid="12508" encounterid="11343058"Heat or cold intolerance no.  HEMATOLOGY/LYMPH no" options="no,yes" propid="91" itemid="199152" categoryid="138157" encounterid="11343058"Abnormal bleeding no. no" options="no,yes" propid="91" itemid="170653" categoryid="138157" encounterid="11343058"Easy bruising no. no" options="no,yes" propid="91" itemid="138158" categoryid="138157" encounterid="11343058"Swollen glands no.  DERMATOLOGY no" options="no,yes" propid="91" itemid="199126" categoryid="12503" encounterid="11343058"New/changing skin  lesion no. no" options="no,yes" propid="91" itemid="12504" categoryid="12503" encounterid="11343058"Rash no. no" options="" propid="91" itemid="444313" categoryid="12503" encounterid="11343058"Sores no.   Negative except as stated in HPI.  Objective: Vitals: Wt 141, Wt change -4 lb, Ht 65, BMI 23.46, Pulse sitting 59, BP sitting 112/72  Past Results: Examination:  General Examination alert, oriented, NAD " categoryPropId="10089" examid="193638"CONSTITUTIONAL: alert, oriented, NAD .  moist, warm" categoryPropId="10109" examid="193638"SKIN: moist, warm.  Conjunctiva clear" categoryPropId="21468" examid="193638"EYES: Conjunctiva clear.  clear to auscultation bilaterally" categoryPropId="87" examid="193638"LUNGS: clear to auscultation bilaterally.  regular rate and rhythm" categoryPropId="86" examid="193638"HEART: regular rate and rhythm.  soft, non-tender/non-distended, bowel sounds present " categoryPropId="88" examid="193638"ABDOMEN: soft, non-tender/non-distended, bowel sounds present .  normal external genitalia,labia - unremarkable,vagina - atrophic mucosa, no lesions or abnormal discharge,cervix - no discharge atrophic cervix that is somewhat flush with the vagina...adnexa - no masses or tenderness,uterus - nontender and normal size on palpation " categoryPropId="13414" examid="193638"FEMALE GENITOURINARY: normal external genitalia,labia - unremarkable,vagina - atrophic mucosa, no lesions or abnormal discharge,cervix - no  discharge atrophic cervix that is somewhat flush with the vagina...adnexa - no masses or tenderness,uterus - nontender and normal size on palpation .  affect normal, good eye contact" categoryPropId="16316" examid="193638"PSYCH: affect normal, good eye contact.  Physical Examination: Chaperone present for pelvic exam, Chapman,Courtney 01/29/2019 11:32:34 AM &gt; "Chaperone present for pelvic exam, Chapman,Courtney 01/29/2019 11:32:34 AM > .   Pt aware of scribe services  today.   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 Her cervix is flush with the vaginal mucosa and it is difficult to visualize in the office due to discomfort with the speculum used. r/b/a of leep discussed. in cluding but not limited to infection bleeding with need for further surgery. Referral To: Reason:please schedule LEEP procedure outpatient as  surgery center  Vaginal atrophy Notes: samples of premarin cream given.. , . d/w pt r/o dvt/ mi / stroke/ breast cancer associated with ERT... This risk is very low and she accepts risk.Marland KitchenMarland KitchenMarland Kitchen

## 2019-02-04 ENCOUNTER — Ambulatory Visit (HOSPITAL_BASED_OUTPATIENT_CLINIC_OR_DEPARTMENT_OTHER): Payer: BLUE CROSS/BLUE SHIELD | Admitting: Anesthesiology

## 2019-02-04 ENCOUNTER — Ambulatory Visit (HOSPITAL_BASED_OUTPATIENT_CLINIC_OR_DEPARTMENT_OTHER)
Admission: RE | Admit: 2019-02-04 | Discharge: 2019-02-04 | Disposition: A | Discharge status: Home / Self Care | Payer: BLUE CROSS/BLUE SHIELD | Attending: Obstetrics and Gynecology | Admitting: Obstetrics and Gynecology

## 2019-02-04 ENCOUNTER — Encounter (HOSPITAL_BASED_OUTPATIENT_CLINIC_OR_DEPARTMENT_OTHER): Payer: Self-pay | Admitting: *Deleted

## 2019-02-04 ENCOUNTER — Other Ambulatory Visit: Payer: Self-pay

## 2019-02-04 ENCOUNTER — Encounter (HOSPITAL_BASED_OUTPATIENT_CLINIC_OR_DEPARTMENT_OTHER)
Admission: RE | Disposition: A | Discharge status: Home / Self Care | Payer: Self-pay | Source: Home / Self Care | Attending: Obstetrics and Gynecology

## 2019-02-04 DIAGNOSIS — N952 Postmenopausal atrophic vaginitis: Secondary | ICD-10-CM | POA: Insufficient documentation

## 2019-02-04 DIAGNOSIS — Z87891 Personal history of nicotine dependence: Secondary | ICD-10-CM | POA: Diagnosis not present

## 2019-02-04 DIAGNOSIS — F419 Anxiety disorder, unspecified: Secondary | ICD-10-CM | POA: Insufficient documentation

## 2019-02-04 DIAGNOSIS — E559 Vitamin D deficiency, unspecified: Secondary | ICD-10-CM | POA: Diagnosis not present

## 2019-02-04 DIAGNOSIS — Z79899 Other long term (current) drug therapy: Secondary | ICD-10-CM | POA: Diagnosis not present

## 2019-02-04 DIAGNOSIS — D067 Carcinoma in situ of other parts of cervix: Secondary | ICD-10-CM | POA: Insufficient documentation

## 2019-02-04 DIAGNOSIS — D069 Carcinoma in situ of cervix, unspecified: Secondary | ICD-10-CM | POA: Diagnosis present

## 2019-02-04 HISTORY — PX: LEEP: SHX91

## 2019-02-04 SURGERY — LEEP (LOOP ELECTROSURGICAL EXCISION PROCEDURE)
Anesthesia: General

## 2019-02-04 MED ORDER — FENTANYL CITRATE (PF) 100 MCG/2ML IJ SOLN: 25.0000 ug | INTRAMUSCULAR | Status: DC | PRN

## 2019-02-04 MED ORDER — IODINE STRONG (LUGOLS) 5 % PO SOLN
ORAL | Status: DC | PRN
  Administered 2019-02-04: .2 mL via ORAL

## 2019-02-04 MED ORDER — PROPOFOL 10 MG/ML IV BOLUS
INTRAVENOUS | Status: DC | PRN
  Administered 2019-02-04: 120 mg via INTRAVENOUS

## 2019-02-04 MED ORDER — ACETIC ACID 4% SOLUTION
Status: DC | PRN
  Administered 2019-02-04: 1 via TOPICAL

## 2019-02-04 MED ORDER — FERRIC SUBSULFATE SOLN
Status: DC | PRN
  Administered 2019-02-04: 1

## 2019-02-04 MED ORDER — HYDROCODONE-ACETAMINOPHEN 5-325 MG PO TABS: 1.0000 | ORAL_TABLET | Freq: Four times a day (QID) | ORAL | 0 refills | Status: DC | PRN

## 2019-02-04 MED ORDER — SCOPOLAMINE 1 MG/3DAYS TD PT72: 1.0000 | MEDICATED_PATCH | Freq: Once | TRANSDERMAL | Status: DC | PRN

## 2019-02-04 MED ORDER — LACTATED RINGERS IV SOLN
INTRAVENOUS | Status: DC
  Administered 2019-02-04 (×2): via INTRAVENOUS

## 2019-02-04 MED ORDER — MIDAZOLAM HCL 2 MG/2ML IJ SOLN
1.0000 mg | INTRAMUSCULAR | Status: DC | PRN
  Administered 2019-02-04: 2 mg via INTRAVENOUS

## 2019-02-04 MED ORDER — DEXAMETHASONE SODIUM PHOSPHATE 4 MG/ML IJ SOLN
INTRAMUSCULAR | Status: DC | PRN
  Administered 2019-02-04: 10 mg via INTRAVENOUS

## 2019-02-04 MED ORDER — IBUPROFEN 800 MG PO TABS: 800.0000 mg | ORAL_TABLET | Freq: Three times a day (TID) | ORAL | 0 refills | Status: DC | PRN

## 2019-02-04 MED ORDER — LIDOCAINE HCL (CARDIAC) PF 100 MG/5ML IV SOSY
PREFILLED_SYRINGE | INTRAVENOUS | Status: DC | PRN
  Administered 2019-02-04: 60 mg via INTRAVENOUS

## 2019-02-04 MED ORDER — FENTANYL CITRATE (PF) 100 MCG/2ML IJ SOLN
INTRAMUSCULAR | Status: AC
  Filled 2019-02-04: qty 2

## 2019-02-04 MED ORDER — FENTANYL CITRATE (PF) 100 MCG/2ML IJ SOLN
50.0000 ug | INTRAMUSCULAR | Status: DC | PRN
  Administered 2019-02-04: 50 ug via INTRAVENOUS

## 2019-02-04 MED ORDER — LIDOCAINE HCL 1 % IJ SOLN
INTRAMUSCULAR | Status: DC | PRN
  Administered 2019-02-04: 10 mL

## 2019-02-04 MED ORDER — ONDANSETRON HCL 4 MG/2ML IJ SOLN
INTRAMUSCULAR | Status: DC | PRN
  Administered 2019-02-04: 4 mg via INTRAVENOUS

## 2019-02-04 MED ORDER — ESTROGENS, CONJUGATED 0.625 MG/GM VA CREA: TOPICAL_CREAM | VAGINAL | 1 refills | Status: DC

## 2019-02-04 MED ORDER — DEXAMETHASONE SODIUM PHOSPHATE 10 MG/ML IJ SOLN
INTRAMUSCULAR | Status: AC
  Filled 2019-02-04: qty 1

## 2019-02-04 MED ORDER — ACETAMINOPHEN 500 MG PO TABS
1000.0000 mg | ORAL_TABLET | ORAL | Status: AC
  Administered 2019-02-04: 1000 mg via ORAL

## 2019-02-04 MED ORDER — ACETAMINOPHEN 500 MG PO TABS
ORAL_TABLET | ORAL | Status: AC
  Filled 2019-02-04: qty 2

## 2019-02-04 MED ORDER — LACTATED RINGERS IV SOLN
INTRAVENOUS | Status: DC
  Administered 2019-02-04: 12:00:00 via INTRAVENOUS

## 2019-02-04 MED ORDER — ONDANSETRON HCL 4 MG/2ML IJ SOLN
INTRAMUSCULAR | Status: AC
  Filled 2019-02-04: qty 2

## 2019-02-04 SURGICAL SUPPLY — 29 items
CATH ROBINSON RED A/P 14FR (CATHETERS) ×3 IMPLANT
DILATOR CANAL MILEX (MISCELLANEOUS) ×1 IMPLANT
ELECT BALL LEEP 3MM BLK (ELECTRODE) IMPLANT
ELECT BALL LEEP 5MM RED (ELECTRODE) IMPLANT
ELECT LOOP LEEP RND 10X10 YLW (CUTTING LOOP)
ELECT LOOP LEEP RND 15X12 GRN (CUTTING LOOP) ×3
ELECT LOOP LEEP RND 20X12 WHT (CUTTING LOOP) ×3
ELECT LOOP LEEP SQR 10X10 ORG (CUTTING LOOP)
ELECT REM PT RETURN 9FT ADLT (ELECTROSURGICAL) ×3
ELECTRODE LOOP LP RND 10X10YLW (CUTTING LOOP) IMPLANT
ELECTRODE LOOP LP RND 15X12GRN (CUTTING LOOP) IMPLANT
ELECTRODE LOOP LP RND 20X12WHT (CUTTING LOOP) IMPLANT
ELECTRODE LOOP LP SQR 10X10ORG (CUTTING LOOP) IMPLANT
ELECTRODE REM PT RTRN 9FT ADLT (ELECTROSURGICAL) ×1 IMPLANT
EXTENDER ELECT LOOP LEEP 10CM (CUTTING LOOP) IMPLANT
GLOVE BIOGEL M 6.5 STRL (GLOVE) ×6 IMPLANT
GLOVE BIOGEL PI IND STRL 6.5 (GLOVE) ×1 IMPLANT
GLOVE BIOGEL PI IND STRL 7.0 (GLOVE) ×1 IMPLANT
GLOVE BIOGEL PI INDICATOR 6.5 (GLOVE) ×2
GLOVE BIOGEL PI INDICATOR 7.0 (GLOVE) ×2
GOWN STRL REUS W/TWL LRG LVL3 (GOWN DISPOSABLE) ×6 IMPLANT
PACK VAGINAL MINOR WOMEN LF (CUSTOM PROCEDURE TRAY) ×3 IMPLANT
PAD OB MATERNITY 4.3X12.25 (PERSONAL CARE ITEMS) ×3 IMPLANT
SCOPETTES 8  STERILE (MISCELLANEOUS) ×4
SCOPETTES 8 STERILE (MISCELLANEOUS) ×2 IMPLANT
SUT VIC AB 0 CT1 27 (SUTURE) ×3
SUT VIC AB 0 CT1 27XBRD ANBCTR (SUTURE) IMPLANT
TOWEL GREEN STERILE FF (TOWEL DISPOSABLE) ×4 IMPLANT
VAC PENCILS W/TUBING CLEAR (MISCELLANEOUS) ×3 IMPLANT

## 2019-02-04 NOTE — Anesthesia Procedure Notes (Signed)
Procedure Name: LMA Insertion Date/Time: 02/04/2019 1:14 PM Performed by: Maryella Shivers, CRNA Pre-anesthesia Checklist: Patient identified, Emergency Drugs available, Suction available and Patient being monitored Patient Re-evaluated:Patient Re-evaluated prior to induction Oxygen Delivery Method: Circle system utilized Preoxygenation: Pre-oxygenation with 100% oxygen Induction Type: IV induction Ventilation: Mask ventilation without difficulty LMA: LMA inserted LMA Size: 4.0 Number of attempts: 1 Airway Equipment and Method: Bite block Placement Confirmation: positive ETCO2 Tube secured with: Tape Dental Injury: Teeth and Oropharynx as per pre-operative assessment

## 2019-02-04 NOTE — Anesthesia Postprocedure Evaluation (Signed)
Anesthesia Post Note  Patient: Nyoka Alcoser  Procedure(s) Performed: LOOP ELECTROSURGICAL EXCISION PROCEDURE (LEEP) (N/A )     Patient location during evaluation: PACU Anesthesia Type: General Level of consciousness: awake and alert Pain management: pain level controlled Vital Signs Assessment: post-procedure vital signs reviewed and stable Respiratory status: spontaneous breathing, nonlabored ventilation, respiratory function stable and patient connected to nasal cannula oxygen Cardiovascular status: blood pressure returned to baseline and stable Postop Assessment: no apparent nausea or vomiting Anesthetic complications: no    Last Vitals:  Vitals:   02/04/19 1401 02/04/19 1415  BP: 117/69 120/68  Pulse: 61 61  Resp: 12 13  Temp: (!) 36.4 C   SpO2: 100% 100%    Last Pain:  Vitals:   02/04/19 1159  TempSrc: Oral  PainSc: 0-No pain                 Lajeana Strough L Eileene Kisling

## 2019-02-04 NOTE — Transfer of Care (Signed)
Immediate Anesthesia Transfer of Care Note  Patient: Lauren Fry  Procedure(s) Performed: LOOP ELECTROSURGICAL EXCISION PROCEDURE (LEEP) (N/A )  Patient Location: PACU  Anesthesia Type:General  Level of Consciousness: sedated  Airway & Oxygen Therapy: Patient Spontanous Breathing and Patient connected to face mask oxygen  Post-op Assessment: Report given to RN and Post -op Vital signs reviewed and stable  Post vital signs: Reviewed and stable  Last Vitals:  Vitals Value Taken Time  BP 117/69 02/04/2019  2:01 PM  Temp 36.4 C 02/04/2019  2:01 PM  Pulse 63 02/04/2019  2:03 PM  Resp 14 02/04/2019  2:03 PM  SpO2 100 % 02/04/2019  2:03 PM  Vitals shown include unvalidated device data.  Last Pain:  Vitals:   02/04/19 1159  TempSrc: Oral  PainSc: 0-No pain         Complications: No apparent anesthesia complications

## 2019-02-04 NOTE — H&P (Signed)
Date of Initial H&P:02/02/2019  History reviewed, patient examined, no change in status, stable for surgery.

## 2019-02-04 NOTE — Op Note (Signed)
02/04/2019  2:00 PM  PATIENT:  Lauren Fry  63 y.o. female  PRE-OPERATIVE DIAGNOSIS:  CERVICAL INTRAEPITHELIAL NEOPLASIA GRADE 3 WITH SEVERE DYSPLASIA  POST-OPERATIVE DIAGNOSIS:  CERVICAL INTRAEPITHELIAL NEOPLASIA GRADE 3 WITH SEVERE DYSPLASIA  PROCEDURE:  Procedure(s): LOOP ELECTROSURGICAL EXCISION PROCEDURE (LEEP) (N/A)  SURGEON:  Surgeon(s) and Role:    Christophe Louis, MD - Primary  PHYSICIAN ASSISTANT:   ASSISTANTS: none   ANESTHESIA:   general  EBL:  10 cc    BLOOD ADMINISTERED:none  DRAINS: none   LOCAL MEDICATIONS USED:  LIDOCAINE   SPECIMEN:  Source of Specimen:  Cervical Cone  DISPOSITION OF SPECIMEN:  PATHOLOGY  COUNTS:  YES  TOURNIQUET:  * No tourniquets in log *  DICTATION: .Dragon Dictation  PLAN OF CARE: Discharge to home after PACU  PATIENT DISPOSITION:  PACU - hemodynamically stable.   Delay start of Pharmacological VTE agent (>24hrs) due to surgical blood loss or risk of bleeding: not applicable  Findings. Normal appearing external genitalia.Marland Kitchen atrophic vaginal mucosa. Atrophic cervix. The cervix was flush with the surrounding vaginal mucosa.   Procedure . Pt was taken to the operating room number 5. General Anesthesia was delivered. She was placed in the dorsal lithotomy position, prepped and draped in the usual sterile fashion..Time out was performed. 1% lidocaine was injected at the 4 and 8 o'clock position of the cervix. The cervix was flush with the  Vaginal mucosa. A single tooth tenaculum was placed anteriorly to help with visualization and stabilization of the cervix during excision. The loop electrode was used to excise a cone portion of the cervix. Monsels was applied to the cervix . Excellent hemostasis was noted. All instruments were removed from the vagina. Patient was awakened from anesthesia and taken to the recovery room in stable condition.

## 2019-02-04 NOTE — Discharge Instructions (Signed)
°  The following instructions have been prepared to help you care for yourself upon your return home.   Personal hygiene:  Use sanitary pads for vaginal drainage, not tampons.  Shower the day after your procedure.  NO tub baths, pools or Jacuzzis for 2-3 weeks.  Wipe front to back after using the bathroom.  Activity and limitations:  Do NOT drive or operate any equipment for 24 hours. The effects of anesthesia are still present and drowsiness may result.  Do NOT rest in bed all day.  Walking is encouraged.  Walk up and down stairs slowly.  You may resume your normal activity in one to two days or as indicated by your physician.  Sexual activity: NO intercourse for at least 2 weeks after the procedure, or as indicated by your physician.  Diet: Eat a light meal as desired this evening. You may resume your usual diet tomorrow.  Return to work: You may resume your work activities in one to two days or as indicated by your doctor.  What to expect after your surgery: Expect to have vaginal bleeding/discharge for 2-3 days and spotting for up to 10 days. It is not unusual to have soreness for up to 1-2 weeks. You may have a slight burning sensation when you urinate for the first day. Mild cramps may continue for a couple of days. You may have a regular period in 2-6 weeks.  Call your doctor for any of the following:  Excessive vaginal bleeding, saturating and changing one pad every hour.  Inability to urinate 6 hours after discharge from hospital.  Pain not relieved by pain medication.  Fever of 100.4 F or greater.  Unusual vaginal discharge or odor.   Call for an appointment:    Patients signature: ______________________  Nurses signature ________________________  Support person's signature_______________________    Post Anesthesia Home Care Instructions  Activity: Get plenty of rest for the remainder of the day. A responsible individual must stay with you for 24  hours following the procedure.  For the next 24 hours, DO NOT: -Drive a car -Paediatric nurse -Drink alcoholic beverages -Take any medication unless instructed by your physician -Make any legal decisions or sign important papers.  Meals: Start with liquid foods such as gelatin or soup. Progress to regular foods as tolerated. Avoid greasy, spicy, heavy foods. If nausea and/or vomiting occur, drink only clear liquids until the nausea and/or vomiting subsides. Call your physician if vomiting continues.  Special Instructions/Symptoms: Your throat may feel dry or sore from the anesthesia or the breathing tube placed in your throat during surgery. If this causes discomfort, gargle with warm salt water. The discomfort should disappear within 24 hours.  May take Tylenol after 4 PM if needed for pain.

## 2019-02-04 NOTE — Anesthesia Preprocedure Evaluation (Addendum)
Anesthesia Evaluation  Patient identified by MRN, date of birth, ID band Patient awake    Reviewed: Allergy & Precautions, NPO status , Patient's Chart, lab work & pertinent test results  Airway Mallampati: I  TM Distance: >3 FB Neck ROM: Full    Dental no notable dental hx. (+) Teeth Intact, Dental Advisory Given   Pulmonary neg pulmonary ROS, former smoker,    Pulmonary exam normal breath sounds clear to auscultation       Cardiovascular negative cardio ROS Normal cardiovascular exam Rhythm:Regular Rate:Normal     Neuro/Psych PSYCHIATRIC DISORDERS Anxiety negative neurological ROS     GI/Hepatic negative GI ROS, Neg liver ROS,   Endo/Other  negative endocrine ROS  Renal/GU negative Renal ROS  negative genitourinary   Musculoskeletal negative musculoskeletal ROS (+)   Abdominal   Peds  Hematology negative hematology ROS (+)   Anesthesia Other Findings CIN grade 3  Reproductive/Obstetrics negative OB ROS                            Anesthesia Physical Anesthesia Plan  ASA: II  Anesthesia Plan: General   Post-op Pain Management:    Induction: Intravenous  PONV Risk Score and Plan: 3 and Midazolam, Dexamethasone and Ondansetron  Airway Management Planned: LMA  Additional Equipment:   Intra-op Plan:   Post-operative Plan: Extubation in OR  Informed Consent: I have reviewed the patients History and Physical, chart, labs and discussed the procedure including the risks, benefits and alternatives for the proposed anesthesia with the patient or authorized representative who has indicated his/her understanding and acceptance.     Dental advisory given  Plan Discussed with: CRNA  Anesthesia Plan Comments:         Anesthesia Quick Evaluation

## 2019-02-05 ENCOUNTER — Encounter (HOSPITAL_BASED_OUTPATIENT_CLINIC_OR_DEPARTMENT_OTHER): Payer: Self-pay | Admitting: Obstetrics and Gynecology

## 2019-07-28 ENCOUNTER — Other Ambulatory Visit: Payer: Self-pay | Admitting: Internal Medicine

## 2019-07-28 DIAGNOSIS — K120 Recurrent oral aphthae: Secondary | ICD-10-CM

## 2019-08-18 ENCOUNTER — Other Ambulatory Visit: Payer: Self-pay | Admitting: Obstetrics and Gynecology

## 2019-08-18 ENCOUNTER — Other Ambulatory Visit (HOSPITAL_COMMUNITY)
Admission: RE | Admit: 2019-08-18 | Discharge: 2019-08-18 | Disposition: A | Discharge status: Home / Self Care | Payer: BC Managed Care – PPO | Source: Ambulatory Visit | Attending: Obstetrics and Gynecology | Admitting: Obstetrics and Gynecology

## 2019-08-18 DIAGNOSIS — D069 Carcinoma in situ of cervix, unspecified: Secondary | ICD-10-CM | POA: Insufficient documentation

## 2019-08-24 ENCOUNTER — Ambulatory Visit (INDEPENDENT_AMBULATORY_CARE_PROVIDER_SITE_OTHER): Payer: Medicare Other | Admitting: Internal Medicine

## 2019-08-24 ENCOUNTER — Encounter: Payer: Self-pay | Admitting: Internal Medicine

## 2019-08-24 ENCOUNTER — Other Ambulatory Visit: Payer: Self-pay

## 2019-08-24 VITALS — BP 106/58 | HR 64 | Temp 97.4°F | Resp 16 | Ht 66.5 in | Wt 145.2 lb

## 2019-08-24 DIAGNOSIS — Z23 Encounter for immunization: Secondary | ICD-10-CM

## 2019-08-24 DIAGNOSIS — I10 Essential (primary) hypertension: Secondary | ICD-10-CM | POA: Diagnosis not present

## 2019-08-24 DIAGNOSIS — Z87891 Personal history of nicotine dependence: Secondary | ICD-10-CM

## 2019-08-24 DIAGNOSIS — Z136 Encounter for screening for cardiovascular disorders: Secondary | ICD-10-CM

## 2019-08-24 DIAGNOSIS — R7303 Prediabetes: Secondary | ICD-10-CM

## 2019-08-24 DIAGNOSIS — E559 Vitamin D deficiency, unspecified: Secondary | ICD-10-CM

## 2019-08-24 DIAGNOSIS — R7309 Other abnormal glucose: Secondary | ICD-10-CM

## 2019-08-24 DIAGNOSIS — E782 Mixed hyperlipidemia: Secondary | ICD-10-CM | POA: Diagnosis not present

## 2019-08-24 DIAGNOSIS — Z79899 Other long term (current) drug therapy: Secondary | ICD-10-CM

## 2019-08-24 DIAGNOSIS — R0989 Other specified symptoms and signs involving the circulatory and respiratory systems: Secondary | ICD-10-CM

## 2019-08-24 DIAGNOSIS — Z1212 Encounter for screening for malignant neoplasm of rectum: Secondary | ICD-10-CM

## 2019-08-24 DIAGNOSIS — Z1211 Encounter for screening for malignant neoplasm of colon: Secondary | ICD-10-CM

## 2019-08-24 NOTE — Progress Notes (Signed)
Comprehensive Evaluation &  Examination     This very nice 63 y.o. MWF  presents for a  comprehensive evaluation and management of multiple medical co-morbidities.  Patient is followed expectantly for elevated BP , has hx/o elevated Lipids,  Prediabetes  and Vitamin D Deficiency.      In April 2020, patient  had Cx conization for Severe dysplasia and recent Pap 08/18/2019  is still pending.         Patient's BP has been controlled at home and patient denies any cardiac symptoms as chest pain, palpitations, shortness of breath, dizziness or ankle swelling. Today's BP is at goal - 106/58.     Patient's hyperlipidemia is controlled with diet and medications. Patient denies myalgias or other medication SE's. Last lipids were not at goal:  Lab Results  Component Value Date   CHOL 208 (H) 07/16/2018   HDL 52 07/16/2018   LDLCALC 130 (H) 07/16/2018   TRIG 144 07/16/2018   CHOLHDL 4.0 07/16/2018       Patient has hx/o prediabetes  A1c 5.7% / 2011 & 5.6% / 2016)  and patient denies reactive hypoglycemic symptoms, visual blurring, diabetic polys or paresthesias. Last A1c was  Normal & at goal:  Lab Results  Component Value Date   HGBA1C 5.4 07/16/2018      Finally, patient has history of Vitamin D Deficiency and last Vitamin D was at goal:  Lab Results  Component Value Date   VD25OH 77 07/16/2018   Current Outpatient Medications on File Prior to Visit  Medication Sig  . aspirin 81 MG chewable tablet Chew 81 mg by mouth daily.  . Calcium Polycarbophil (FIBER-CAPS PO) Take by mouth.  . Cholecalciferol (VITAMIN D PO) Take 5,000 Units by mouth.  . citalopram (CELEXA) 40 MG tablet TAKE 1 TABLET DAILY FOR MOOD  . conjugated estrogens (PREMARIN) vaginal cream 1 gram per vagina daily for 2 weeks then 2 times a week  . famciclovir (FAMVIR) 500 MG tablet Take 1 tablet 2 x /day  . Magnesium 250 MG TABS Take 250 mg by mouth daily.   . Milk Thistle 250 MG CAPS Take 1 capsule (250 mg total) by  mouth daily.  . Misc Natural Products (LUTEIN 20 PO) Take by mouth.  . Omega 3 1000 MG CAPS Take by mouth.  . Turmeric, Curcuma Longa, (CURCUMIN) POWD by Does not apply route. Takes the Curcumin in a capsule-OTC.  Marland Kitchen vitamin C (ASCORBIC ACID) 500 MG tablet Take 500 mg by mouth daily.  . vitamin E 400 UNIT capsule Take 400 Units by mouth daily.  Marland Kitchen zinc gluconate 50 MG tablet Take 50 mg by mouth daily.   No current facility-administered medications on file prior to visit.    No Known Allergies   Past Medical History:  Diagnosis Date  . Anxiety   . Breast mass, right   . HSV-2 (herpes simplex virus 2) infection   . Hyperlipidemia   . Unspecified vitamin D deficiency   . Varicose veins    Health Maintenance  Topic Date Due  . INFLUENZA VACCINE  06/05/2019  . PNA vac Low Risk Adult (1 of 2 - PCV13) 08/10/2019  . MAMMOGRAM  12/12/2019  . PAP SMEAR-Modifier  12/04/2021  . COLONOSCOPY  04/30/2023  . TETANUS/TDAP  03/09/2024  . DEXA SCAN  Completed  . Hepatitis C Screening  Completed  . HIV Screening  Completed   Immunization History  Administered Date(s) Administered  . Influenza-Unspecified 07/06/2018  . PPD Test  03/09/2014, 03/13/2015, 03/21/2016, 04/22/2017, 07/16/2018  . Tdap 03/09/2014  . Zoster 08/26/2014   Last Colon -  04/29/2018 - Dr Watt Climes - recc 10 yr f/u - due July 2029  Last MGM - 12/11/2018  Past Surgical History:  Procedure Laterality Date  . BREAST EXCISIONAL BIOPSY Right 02/25/2017   benign  . BREAST LUMPECTOMY WITH RADIOACTIVE SEED LOCALIZATION Right 02/25/2017   Procedure: RIGHT BREAST LUMPECTOMY WITH RADIOACTIVE SEED LOCALIZATION;  Surgeon: Rolm Bookbinder, MD;  Location: [facility];  Service: General;  Laterality: Right;  . LASIK  1990  . LASIK  1990  . LEEP N/A 02/04/2019   Procedure: LOOP ELECTROSURGICAL EXCISION PROCEDURE (LEEP);  Surgeon: Christophe Louis, MD;  Location: [facility];  Service: Gynecology;  Laterality: N/A;    Family History  Problem Relation Age of Onset  . Cancer Mother 74       lung  . Dementia Father   . Cancer Sister 73       colon  . Cancer Brother 7       colon   Social History   Tobacco Use  . Smoking status: Former Smoker    Quit date: 11/04/1982    Years since quitting: 36.8  . Smokeless tobacco: Never Used  Substance Use Topics  . Alcohol use: Yes    Alcohol/week: 4.0 standard drinks    Types: 4 Glasses of wine per week    Comment: social  . Drug use: No    ROS Constitutional: Denies fever, chills, weight loss/gain, headaches, insomnia,  night sweats, and change in appetite. Does c/o fatigue. Eyes: Denies redness, blurred vision, diplopia, discharge, itchy, watery eyes.  ENT: Denies discharge, congestion, post nasal drip, epistaxis, sore throat, earache, hearing loss, dental pain, Tinnitus, Vertigo, Sinus pain, snoring.  Cardio: Denies chest pain, palpitations, irregular heartbeat, syncope, dyspnea, diaphoresis, orthopnea, PND, claudication, edema Respiratory: denies cough, dyspnea, DOE, pleurisy, hoarseness, laryngitis, wheezing.  Gastrointestinal: Denies dysphagia, heartburn, reflux, water brash, pain, cramps, nausea, vomiting, bloating, diarrhea, constipation, hematemesis, melena, hematochezia, jaundice, hemorrhoids Genitourinary: Denies dysuria, frequency, urgency, nocturia, hesitancy, discharge, hematuria, flank pain Breast: Breast lumps, nipple discharge, bleeding.  Musculoskeletal: Denies arthralgia, myalgia, stiffness, Jt. Swelling, pain, limp, and strain/sprain. Denies falls. Skin: Denies puritis, rash, hives, warts, acne, eczema, changing in skin lesion Neuro: No weakness, tremor, incoordination, spasms, paresthesia, pain Psychiatric: Denies confusion, memory loss, sensory loss. Denies Depression. Endocrine: Denies change in weight, skin, hair change, nocturia, and paresthesia, diabetic polys, visual blurring, hyper / hypo glycemic episodes.  Heme/Lymph: No  excessive bleeding, bruising, enlarged lymph nodes.  Physical Exam  BP (!) 106/58   Pulse 64   Temp (!) 97.4 F (36.3 C)   Resp 16   Ht 5' 6.5" (1.689 m)   Wt 145 lb 3.2 oz (65.9 kg)   BMI 23.08 kg/m   General Appearance: Well nourished, well groomed and in no apparent distress.  Eyes: PERRLA, EOMs, conjunctiva no swelling or erythema, normal fundi and vessels. Sinuses: No frontal/maxillary tenderness ENT/Mouth: EACs patent / TMs  nl. Nares clear without erythema, swelling, mucoid exudates. Oral hygiene is good. No erythema, swelling, or exudate. Tongue normal, non-obstructing. Tonsils not swollen or erythematous. Hearing normal.  Neck: Supple, thyroid not palpable. No bruits, nodes or JVD. Respiratory: Respiratory effort normal.  BS equal and clear bilateral without rales, rhonci, wheezing or stridor. Cardio: Heart sounds are normal with regular rate and rhythm and no murmurs, rubs or gallops. Peripheral pulses are normal and equal bilaterally without edema. No  aortic or femoral bruits. Chest: symmetric with normal excursions and percussion. Breasts: Symmetric, without lumps, nipple discharge, retractions, or fibrocystic changes.  Abdomen: Flat, soft with bowel sounds active. Nontender, no guarding, rebound, hernias, masses, or organomegaly.  Lymphatics: Non tender without lymphadenopathy.  Genitourinary:  Musculoskeletal: Full ROM all peripheral extremities, joint stability, 5/5 strength, and normal gait. Skin: Warm and dry without rashes, lesions, cyanosis, clubbing or  ecchymosis.  Neuro: Cranial nerves intact, reflexes equal bilaterally. Normal muscle tone, no cerebellar symptoms. Sensation intact.  Pysch: Alert and oriented X 3, normal affect, Insight and Judgment appropriate.   Assessment and Plan  1. Labile hypertension  - EKG 12-Lead - Urinalysis, Routine w reflex microscopic - Microalbumin / creatinine urine ratio - CBC with Differential/Platelet - COMPLETE METABOLIC  PANEL WITH GFR - Magnesium - TSH  2. Hyperlipidemia, mixed  - EKG 12-Lead - Lipid panel - TSH  3. Abnormal glucose  - EKG 12-Lead - Hemoglobin A1c - Insulin, random  4. Vitamin D deficiency  - VITAMIN D 25 Hydroxyl 5. Prediabetes  - EKG 12-Lead - Hemoglobin A1c - Insulin, random  6. Screening for colorectal cancer  - POC Hemoccult Bld/Stl  7. Screening for ischemic heart disease  - EKG 12-Lead  8. Former smoker  - EKG 12-Lead  9. Medication management  - Urinalysis, Routine w reflex microscopic - Microalbumin / creatinine urine ratio - CBC with Differential/Platelet - COMPLETE METABOLIC PANEL WITH GFR - Magnesium - Lipid panel - TSH - Hemoglobin A1c - Insulin, random - VITAMIN D 25 Hydroxyl  10. Need for immunization against influenza  - Flu vaccine HIGH DOSE PF (Fluzone High dose)  11. Need for prophylactic vaccination against  Streptococcus pneumoniae (pneumococcus)  - Pneumococcal conjugate vaccine 13-valent           Patient was counseled in prudent diet to achieve/maintain BMI less than 25 for weight control, BP monitoring, regular exercise and medications. Discussed med's effects and SE's. Screening labs and tests as requested with regular follow-up as recommended. Over 40 minutes of exam, counseling, chart review and high complex critical decision making was performed.   Kirtland Bouchard, MD

## 2019-08-24 NOTE — Patient Instructions (Signed)

## 2019-08-25 LAB — LIPID PANEL
Cholesterol: 194 mg/dL (ref <200)
HDL: 52 mg/dL (ref >=50)
LDL Cholesterol (Calc): 114 mg/dL (calc) — ABNORMAL HIGH
Non-HDL Cholesterol (Calc): 142 mg/dL (calc) — ABNORMAL HIGH (ref <130)
Total CHOL/HDL Ratio: 3.7 (calc) (ref <5.0)
Triglycerides: 166 mg/dL — ABNORMAL HIGH (ref <150)

## 2019-08-25 LAB — CBC WITH DIFFERENTIAL/PLATELET
Absolute Monocytes: 410 cells/uL (ref 200–950)
Basophils Absolute: 29 cells/uL (ref 0–200)
Basophils Relative: 0.4 %
Eosinophils Absolute: 29 cells/uL (ref 15–500)
Eosinophils Relative: 0.4 %
HCT: 39.5 % (ref 35.0–45.0)
Hemoglobin: 13.4 g/dL (ref 11.7–15.5)
Lymphs Abs: 1634 cells/uL (ref 850–3900)
MCH: 32.6 pg (ref 27.0–33.0)
MCHC: 33.9 g/dL (ref 32.0–36.0)
MCV: 96.1 fL (ref 80.0–100.0)
MPV: 11.1 fL (ref 7.5–12.5)
Monocytes Relative: 5.7 %
Neutro Abs: 5098 cells/uL (ref 1500–7800)
Neutrophils Relative %: 70.8 %
Platelets: 248 10*3/uL (ref 140–400)
RBC: 4.11 10*6/uL (ref 3.80–5.10)
RDW: 12.5 % (ref 11.0–15.0)
Total Lymphocyte: 22.7 %
WBC: 7.2 10*3/uL (ref 3.8–10.8)

## 2019-08-25 LAB — COMPLETE METABOLIC PANEL WITH GFR
AG Ratio: 1.9 (calc) (ref 1.0–2.5)
ALT: 13 U/L (ref 6–29)
AST: 19 U/L (ref 10–35)
Albumin: 4.3 g/dL (ref 3.6–5.1)
Alkaline phosphatase (APISO): 60 U/L (ref 37–153)
BUN: 23 mg/dL (ref 7–25)
CO2: 25 mmol/L (ref 20–32)
Calcium: 9.2 mg/dL (ref 8.6–10.4)
Chloride: 105 mmol/L (ref 98–110)
Creat: 0.85 mg/dL (ref 0.50–0.99)
GFR, Est African American: 83 mL/min/{1.73_m2} (ref >=60)
GFR, Est Non African American: 72 mL/min/{1.73_m2} (ref >=60)
Globulin: 2.3 g/dL (calc) (ref 1.9–3.7)
Glucose, Bld: 128 mg/dL — ABNORMAL HIGH (ref 65–99)
Potassium: 4.5 mmol/L (ref 3.5–5.3)
Sodium: 140 mmol/L (ref 135–146)
Total Bilirubin: 0.4 mg/dL (ref 0.2–1.2)
Total Protein: 6.6 g/dL (ref 6.1–8.1)

## 2019-08-25 LAB — URINALYSIS, ROUTINE W REFLEX MICROSCOPIC
Bacteria, UA: NONE SEEN /HPF
Bilirubin Urine: NEGATIVE
Glucose, UA: NEGATIVE
Hgb urine dipstick: NEGATIVE
Hyaline Cast: NONE SEEN /LPF
Ketones, ur: NEGATIVE
Nitrite: NEGATIVE
Protein, ur: NEGATIVE
Specific Gravity, Urine: 1.015 (ref 1.001–1.03)
pH: <=5 (ref 5.0–8.0)

## 2019-08-25 LAB — MICROALBUMIN / CREATININE URINE RATIO
Creatinine, Urine: 73 mg/dL (ref 20–275)
Microalb Creat Ratio: 5 mcg/mg creat (ref <30)
Microalb, Ur: 0.4 mg/dL

## 2019-08-25 LAB — HEMOGLOBIN A1C
Hgb A1c MFr Bld: 5.1 % of total Hgb (ref <5.7)
Mean Plasma Glucose: 100 (calc)
eAG (mmol/L): 5.5 (calc)

## 2019-08-25 LAB — MAGNESIUM: Magnesium: 2 mg/dL (ref 1.5–2.5)

## 2019-08-25 LAB — TSH: TSH: 4.03 mIU/L (ref 0.40–4.50)

## 2019-08-25 LAB — VITAMIN D 25 HYDROXY (VIT D DEFICIENCY, FRACTURES): Vit D, 25-Hydroxy: 76 ng/mL (ref 30–100)

## 2019-08-25 LAB — INSULIN, RANDOM: Insulin: 20.2 u[IU]/mL — ABNORMAL HIGH

## 2019-09-08 LAB — CYTOLOGY - PAP
Comment: NEGATIVE
Comment: NEGATIVE
Diagnosis: NEGATIVE
HPV 16: POSITIVE — AB
HPV 18 / 45: NEGATIVE
High risk HPV: POSITIVE — AB

## 2019-09-22 ENCOUNTER — Other Ambulatory Visit: Payer: Self-pay | Admitting: Internal Medicine

## 2019-11-20 ENCOUNTER — Other Ambulatory Visit (HOSPITAL_COMMUNITY)
Admission: RE | Admit: 2019-11-20 | Discharge: 2019-11-20 | Disposition: A | Discharge status: Home / Self Care | Payer: BC Managed Care – PPO | Source: Ambulatory Visit | Attending: Obstetrics and Gynecology | Admitting: Obstetrics and Gynecology

## 2019-11-20 DIAGNOSIS — Z01812 Encounter for preprocedural laboratory examination: Secondary | ICD-10-CM | POA: Insufficient documentation

## 2019-11-20 DIAGNOSIS — Z20822 Contact with and (suspected) exposure to covid-19: Secondary | ICD-10-CM | POA: Diagnosis not present

## 2019-11-21 LAB — NOVEL CORONAVIRUS, NAA (HOSP ORDER, SEND-OUT TO REF LAB; TAT 18-24 HRS): SARS-CoV-2, NAA: NOT DETECTED

## 2019-11-22 ENCOUNTER — Other Ambulatory Visit: Payer: Self-pay | Admitting: Obstetrics and Gynecology

## 2019-11-22 ENCOUNTER — Other Ambulatory Visit (HOSPITAL_COMMUNITY): Payer: Self-pay | Admitting: *Deleted

## 2019-11-22 NOTE — Patient Instructions (Addendum)
ONCE YOUR COVID TEST IS DONE PLEASE FOLLOW ALL THE QUARANTINE  INSTRUCTIONS GIVEN IN YOUR HANDOUT.      Your procedure is scheduled on Wednesday 11/24/2019   Report to [facility]   A. M.   Call this number if you have problems the morning of surgery  :[phone].   OUR ADDRESS IS [place].  WE ARE LOCATED IN [facility].                                     REMEMBER:   DO NOT EAT FOOD OR DRINK LIQUIDS AFTER MIDNIGHT .     YOU MAY  BRUSH YOUR TEETH MORNING OF SURGERY AND RINSE YOUR MOUTH OUT, NO CHEWING GUM CANDY OR MINTS.    TAKE THESE MEDICATIONS MORNING OF SURGERY WITH A SIP OF WATER:   Citalopram (Celexa), Famciclovir (Famvir)    IF YOU ARE SPENDING THE NIGHT AFTER SURGERY PLEASE BRING ALL YOUR PRESCRIPTION MEDICATIONS IN THEIR ORIGINAL BOTTLES. 1 VISITOR IS ALLOWED IN WAITING ROOM ONLY DAY OF SURGERY. NO VISITOR MAY SPEND THE NIGHT. VISITOR ARE ALLOWED TO STAY UNTIL 800 PM.                                    DO NOT WEAR JEWERLY, MAKE UP, OR NAIL POLISH ON FINGERNAILS. DO NOT WEAR LOTIONS, POWDERS, PERFUMES OR DEODORANT. DO NOT SHAVE FOR 24 HOURS PRIOR TO DAY OF SURGERY.  CONTACTS, GLASSES, OR DENTURES MAY NOT BE WORN TO SURGERY.                                    [facility] IS NOT RESPONSIBLE  FOR ANY BELONGINGS.                                                                    Marland Kitchen                                                                                                    [facility] - Preparing for Surgery Before surgery, you can play an important role.  Because skin is not sterile, your skin needs to be as free of germs as possible.  You can reduce the number of germs on your skin by washing with CHG (chlorahexidine gluconate) soap before surgery.  CHG is an antiseptic cleaner which kills germs and bonds with the skin to continue killing germs even after washing. Please DO NOT use if you have an allergy to  CHG or antibacterial soaps.  If your skin becomes reddened/irritated stop  using the CHG and inform your nurse when you arrive at Short Stay. Do not shave (including legs and underarms) for at least 48 hours prior to the first CHG shower.  You may shave your face/neck. Please follow these instructions carefully:  1.  Shower with CHG Soap the night before surgery and the  morning of Surgery.  2.  If you choose to wash your hair, wash your hair first as usual with your  normal  shampoo.  3.  After you shampoo, rinse your hair and body thoroughly to remove the  shampoo.                           4.  Use CHG as you would any other liquid soap.  You can apply chg directly  to the skin and wash                       Gently with a scrungie or clean washcloth.  5.  Apply the CHG Soap to your body ONLY FROM THE NECK DOWN.   Do not use on face/ open                           Wound or open sores. Avoid contact with eyes, ears mouth and genitals (private parts).                       Wash face,  Genitals (private parts) with your normal soap.             6.  Wash thoroughly, paying special attention to the area where your surgery  will be performed.  7.  Thoroughly rinse your body with warm water from the neck down.  8.  DO NOT shower/wash with your normal soap after using and rinsing off  the CHG Soap.                9.  Pat yourself dry with a clean towel.            10.  Wear clean pajamas.            11.  Place clean sheets on your bed the night of your first shower and do not  sleep with pets. Day of Surgery : Do not apply any lotions/deodorants the morning of surgery.  Please wear clean clothes to the hospital/surgery center.  FAILURE TO FOLLOW THESE INSTRUCTIONS MAY RESULT IN THE CANCELLATION OF YOUR SURGERY PATIENT SIGNATURE_________________________________  NURSE SIGNATURE__________________________________  ________________________________________________________________________   Adam Phenix  An incentive spirometer is a tool that can help keep your lungs clear and active. This tool measures how well you are filling your lungs with each breath. Taking long deep breaths may help reverse or decrease the chance of developing breathing (pulmonary) problems (especially infection) following:  A long period of time when you are unable to move or be active. BEFORE THE PROCEDURE   If the spirometer includes an indicator to show your best effort, your nurse or respiratory therapist will set it to a desired goal.  If possible, sit up straight or lean slightly forward. Try not to slouch.  Hold the incentive spirometer in an upright position. INSTRUCTIONS FOR USE  1. Sit on the edge of your bed if possible, or sit up as far as you can in bed or on a chair. 2. Hold the incentive spirometer  in an upright position. 3. Breathe out normally. 4. Place the mouthpiece in your mouth and seal your lips tightly around it. 5. Breathe in slowly and as deeply as possible, raising the piston or the ball toward the top of the column. 6. Hold your breath for 3-5 seconds or for as long as possible. Allow the piston or ball to fall to the bottom of the column. 7. Remove the mouthpiece from your mouth and breathe out normally. 8. Rest for a few seconds and repeat Steps 1 through 7 at least 10 times every 1-2 hours when you are awake. Take your time and take a few normal breaths between deep breaths. 9. The spirometer may include an indicator to show your best effort. Use the indicator as a goal to work toward during each repetition. 10. After each set of 10 deep breaths, practice coughing to be sure your lungs are clear. If you have an incision (the cut made at the time of surgery), support your incision when coughing by placing a pillow or rolled up towels firmly against it. Once you are able to get out of bed, walk around indoors and cough well. You may stop using the incentive spirometer when  instructed by your caregiver.  RISKS AND COMPLICATIONS  Take your time so you do not get dizzy or light-headed.  If you are in pain, you may need to take or ask for pain medication before doing incentive spirometry. It is harder to take a deep breath if you are having pain. AFTER USE  Rest and breathe slowly and easily.  It can be helpful to keep track of a log of your progress. Your caregiver can provide you with a simple table to help with this. If you are using the spirometer at home, follow these instructions: [place] IF:   You are having difficultly using the spirometer.  You have trouble using the spirometer as often as instructed.  Your pain medication is not giving enough relief while using the spirometer.  You develop fever of 100.5 F (38.1 C) or higher. SEEK IMMEDIATE MEDICAL CARE IF:   You cough up bloody sputum that had not been present before.  You develop fever of 102 F (38.9 C) or greater.  You develop worsening pain at or near the incision site. MAKE SURE YOU:   Understand these instructions.  Will watch your condition.  Will get help right away if you are not doing well or get worse. Document Released: 03/03/2007 Document Revised: 01/13/2012 Document Reviewed: 05/04/2007 [facility] Patient Information 2014 [place], [place].   ________________________________________________________________________

## 2019-11-22 NOTE — H&P (Signed)
Patient:Lauren Fry, Jyotsna, Norvell DOB: [birth year] Age: 64 Y Sex: Female Scribe  Phone: [phone] Primary Insurance: BCBS Payer ID: [id number] Address: [address], [place] [id number] Account Number: [id number] PCP: Unk Pinto, MD  Encounter Date: 11/19/2019 Provider: Christophe Louis, MD Appointment [facility]: [facility]   Subjective: Chief Complaint(s):   Recurrent cervical dysplasia   HPI:  Isolation Precautions 1. Is fever present / reported?: No, 2. Are respiratory illness symptom(s) present / reported?: No, 3. Are other symptom(s) present / reported?: No, 5. Has there been reported travel to a High Risk respiratory illness region?: Unknown, 6. Has close* contact with person(s) known to have communicable illness been reported?: No" label="Respiratory Illness Screening" propId="[id number]" catId="[id number]" encId="[id number]"Respiratory Illness Screening 1. Is fever present / reported? No, 2. Are respiratory illness symptom(s) present / reported? No, 3. Are other symptom(s) present / reported? No, 5. Has there been reported travel to a High Risk respiratory illness region? Unknown, 6. Has close* contact with person(s) known to have communicable illness been reported? No. Has patient been tested for COVID-19? No.  General 64 y/o presents for pre op visit She is scheduled for a laproscopic assisted vaginal hysterectomy with bilateral salpingo-oophorectomy for the prevention of recurrent cervical dysplasia on 11/24/2019 at 12:25pm, at the [facility].  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 She had a LEEP procedure 02/04/2019 for the management of severe cervical dysplasia. Her pathology revealed low grade cervical dysplasia. The endocervical margin was not  involved, however it did extend to the ectocervical margin. She had a surveillence pap smear 08/18/2019 that was negative however high risk hpv was detected. Pt. mentions that she might travel a week after surgery 4 hours away. Discussed with her that is ok as long as she makes frequent stops to get out and walk around to prevent any blood clots.  Pt. had a question about receiving covid vaccination prior to surgery. She is advised that this is fine as well. Upon pelvic, exam was overall normal. Current Medication: Taking Premarin(Estrogens Conjugated) 0.625 MG/GM Cream as directed Vaginal 2 times a week, Notes: twice a week. Vitamin D (Cholecalciferol) 1 tablet daily. Famciclovir 500 MG Tablet 1 tablet Orally every 8 hrs. Citalopram Hydrobromide 20 MG Tablet 1 tablet Orally Once a day. Medical History:  history of osteoporosis     lung nodule, stable on CT 2/09 no need to further follow     family history colon cancer, Magod     varicose veins     hsv 2      Allergies/Intolerance: N.K.D.A. Gyn History:  Sexual activity currently sexually active. Periods : postmenopausal. LMP 2009 ish. Last pap smear date 08/18/2019-neg/HPV dect. Last mammogram date 12/11/2018. Denies Abnormal pap smear treated with LEEP. H/O STD Herpes.   OB History:  Number of pregnancies 3. Pregnancy # 1 live birth, vaginal delivery, girl. Pregnancy # 2 live birth, vaginal delivery, girl. Pregnancy # 3 live birth, vaginal delivery, girl.   Surgical History:  Lasik eye surgery     lumpectomy, right breast 01/2017     LEEP 02/06/2019   Hospitalization:  childbirth 56, 58, 67   Family History:  Father: deceased 76 yrs, dementia    Mother: deceased, lung cancer at 21, smoker    Paternal  [place] Father: deceased, colon cancer    Paternal [place] Mother: deceased    Maternal Grand Father: deceased    Maternal Grand Mother: deceased    Brother 1: deceased, colon cancer, diagnosed with Colon cancer in 25     [place]: alive    Sister 1: deceased, colon cancer at 53, osteoporosis, liver cancer, Colon cancer in 92    Sister 2: alive, osteoporosis    Sister 3: alive    1 brother(s) , 5 sister(s) . 3daughter(s) .    Negative for liver disease- brother and sister with colon cancer.  Social History: General Tobacco use cigarettes: Former smoker, Tobacco history last updated 11/19/2019.  no EXPOSURE TO PASSIVE SMOKE.  Alcohol: yes, one to 2 glasses of wine 2 times a week.  Caffeine: yes, coffee.  no Recreational drug use.  Exercise: yes, 3-4 times per week.  Marital Status: divorced 4/08.  Children: Lauralyn Primes.  OCCUPATION: retired.  Religion: [employer].   ROS: CONSTITUTIONAL No" label="Chills" value="" options="no,yes" propid="91" itemid="193425" categoryid="10464" encounterid="[id number]"Chills No. No" label="Fatigue" value="" options="no,yes" propid="91" itemid="172899" categoryid="10464" encounterid="[id number]"Fatigue No. No" label="Fever" value="" options="no,yes" propid="91" itemid="10467" categoryid="10464" encounterid="[id number]"Fever No. No" label="Night sweats" value="" options="no,yes" propid="91" itemid="193426" categoryid="10464" encounterid="[id number]"Night sweats No. No" label="Recent travel outside [place]" value="" options="no,yes" propid="91" itemid="444261" categoryid="10464" encounterid="[id number]"Recent travel outside [place] No. No" label="Sweats" value="" options="no,yes" propid="91" itemid="193427" categoryid="10464" encounterid="[id number]"Sweats No. No" label="Weight change" value="" options="no,yes" propid="91" itemid="194825" categoryid="10464" encounterid="[id number]"Weight change No.  OPHTHALMOLOGY no" label="Blurring of vision" value="" options="no,yes" propid="91" itemid="12520" categoryid="12516" encounterid="[id number]"Blurring of vision no. no" label="Change in vision" value="" options="no,yes" propid="91" itemid="193469" categoryid="12516"  encounterid="[id number]"Change in vision no. no" label="Double vision" value="" options="no,yes" propid="91" itemid="194379" categoryid="12516" encounterid="[id number]"Double vision no.  ENT no" label="Dizziness" value="" options="no,yes" propid="91" itemid="193612" categoryid="10481" encounterid="[id number]"Dizziness no. Nose bleeds no. Sore throat no. Teeth pain no.  ALLERGY no" label="Hives" value="" options="no,yes" propid="91" itemid="202589" categoryid="138152" encounterid="[id number]"Hives no.  CARDIOLOGY no" label="Chest pain" value="" options="no,yes" propid="91" itemid="193603" categoryid="10488" encounterid="[id number]"Chest pain no. no" label="High blood pressure" value="" options="no,yes" propid="91" itemid="199089" categoryid="10488" encounterid="[id number]"High blood pressure no. no" label="Irregular heart beat" value="" options="no,yes" propid="91" itemid="202598" categoryid="10488" encounterid="[id number]"Irregular heart beat no. no" label="Leg edema" value="" options="no,yes" propid="91" itemid="10491" categoryid="10488" encounterid="[id number]"Leg edema no. no" label="Palpitations" value="" options="no,yes" propid="91" itemid="10490" categoryid="10488" encounterid="[id number]"Palpitations no.  RESPIRATORY no" label="Shortness of breath" value="" options="no" propid="91" itemid="270013" categoryid="138132" encounterid="[id number]"Shortness of breath no. no" label="Cough" value="" options="no,yes" propid="91" itemid="172745" categoryid="138132" encounterid="[id number]"Cough no. no" label="Wheezing" value="" options="no,yes" propid="91" itemid="193621" categoryid="138132" encounterid="12260414"Wheezing no.  UROLOGY no" label="Pain with urination" value="" options="no,yes" propid="91" itemid="194377" categoryid="138166" encounterid="[id number]"Pain with urination no. no" label="Urinary urgency" value="" options="no,yes" propid="91" itemid="193493" categoryid="138166" encounterid="[id number]"Urinary urgency no. no"  label="Urinary frequency" value="" options="no,yes" propid="91" itemid="193492" categoryid="138166" encounterid="[id number]"Urinary frequency no. no" label="Urinary incontinence" value="" options="no,yes" propid="91" itemid="138171" categoryid="138166" encounterid="[id number]"Urinary incontinence no. No" label="Difficulty urinating" value="" options="no,yes" propid="91" itemid="138167" categoryid="138166" encounterid="[id number]"Difficulty urinating No. No" label="Blood in urine" value="" options="no,yes" propid="91" itemid="138168" categoryid="138166" encounterid="[id number]"Blood in urine No.  GASTROENTEROLOGY no" label="Abdominal pain" value="" options="no,yes" propid="91" itemid="10496" categoryid="10494" encounterid="[id number]"Abdominal pain no. no" label="Appetite change" value="" options="no,yes" propid="91" itemid="193447" categoryid="10494" encounterid="[id number]"Appetite change no. no" label="Bloating/belching" value="" options="no,yes" propid="91" itemid="193448" categoryid="10494" encounterid="[id number]"Bloating/belching no. no" label="Blood in stool or on toilet paper" value="" options="no,yes" propid="91" itemid="10503" categoryid="10494" encounterid="[id number]"Blood in stool or on toilet paper no. no" label="Change in bowel movements" value="" options="no,yes" propid="91" itemid="199106" categoryid="10494" encounterid="[id number]"Change in bowel movements no. no" label="Constipation" value="" options="no,yes" propid="91" itemid="10501" categoryid="10494" encounterid="[id number]"Constipation no. no" label="Diarrhea" value="" options="no,yes" propid="91" itemid="10502" categoryid="10494" encounterid="[id number]"Diarrhea no. no" label="Difficulty swallowing" value="" options="no,yes" propid="91" itemid="199104" categoryid="10494" encounterid="[id number]"Difficulty swallowing no. no" label="Nausea" value="" options="no,yes" propid="91" itemid="10499" categoryid="10494" encounterid="[id number]"Nausea no.  FEMALE  REPRODUCTIVE no" label="Vulvar pain" value="" options="no,yes" propid="91" itemid="453725" categoryid="10525" encounterid="[id number]"Vulvar pain  no. no" label="Vulvar rash" value="" options="no,yes" propid="91" itemid="453726" categoryid="10525" encounterid="[id number]"Vulvar rash no. no" label="Abnormal vaginal bleeding" value="" options="no, yes" propid="91" itemid="444315" categoryid="10525" encounterid="[id number]"Abnormal vaginal bleeding no. no" label="Breast pain" value="" options="no,yes" propid="91" itemid="186083" categoryid="10525" encounterid="[id number]"Breast pain no. no" label="Nipple discharge" value="" options="no,yes" propid="91" itemid="186084" categoryid="10525" encounterid="[id number]"Nipple discharge no. no" label="Pain with intercourse" value="" options="no,yes" propid="91" itemid="275823" categoryid="10525" encounterid="[id number]"Pain with intercourse no. no" label="Pelvic pain" value="" options="no,yes" propid="91" itemid="186082" categoryid="10525" encounterid="[id number]"Pelvic pain no. no" label="Unusual vaginal discharge" value="" options="no,yes" propid="91" itemid="278230" categoryid="10525" encounterid="[id number]"Unusual vaginal discharge no. no" label="Vaginal itching" value="" options="no,yes" propid="91" itemid="278942" categoryid="10525" encounterid="[id number]"Vaginal itching no.  MUSCULOSKELETAL no" label="Muscle aches" value="" options="no,yes" propid="91" itemid="193461" categoryid="10514" encounterid="[id number]"Muscle aches no.  NEUROLOGY no" label="Headache" value="" options="no,yes" propid="91" itemid="12513" categoryid="12512" encounterid="[id number]"Headache no. no" label="Tingling/numbness" value="" options="no,yes" propid="91" itemid="12514" categoryid="12512" encounterid="[id number]"Tingling/numbness no. no" label="Weakness" value="" options="no,yes" propid="91" itemid="193468" categoryid="12512" encounterid="12260414"Weakness no.  PSYCHOLOGY no" label="Depression" value=""  options="" propid="91" itemid="275919" categoryid="10520" encounterid="[id number]"Depression no. no" label="Anxiety" value="" options="no,yes" propid="91" itemid="172748" categoryid="10520" encounterid="12260414"Anxiety no. no" label="Nervousness" value="" options="no,yes" propid="91" itemid="199158" categoryid="10520" encounterid="12260414"Nervousness no. no" label="Sleep disturbances" value="" options="no,yes" propid="91" itemid="12502" categoryid="10520" encounterid="[id number]"Sleep disturbances no. no " label="Suicidal ideation" value="" options="no,yes" propid="91" itemid="72718" categoryid="10520" encounterid="12260414"Suicidal ideation no .  ENDOCRINOLOGY no" label="Excessive thirst" value="" options="no,yes" propid="91" itemid="194628" categoryid="12508" encounterid="12260414"Excessive thirst no. no" label="Excessive urination" value="" options="no,yes" propid="91" itemid="196285" categoryid="12508" encounterid="12260414"Excessive urination no. no" label="Hair loss" value="" options="no, yes" propid="91" itemid="444314" categoryid="12508" encounterid="12260414"Hair loss no. no" label="Heat or cold intolerance" value="" options="" propid="91" itemid="447284" categoryid="12508" encounterid="12260414"Heat or cold intolerance no.  HEMATOLOGY/LYMPH no" label="Abnormal bleeding" value="" options="no,yes" propid="91" itemid="199152" categoryid="138157" encounterid="12260414"Abnormal bleeding no. no" label="Easy bruising" value="" options="no,yes" propid="91" itemid="170653" categoryid="138157" encounterid="12260414"Easy bruising no. no" label="Swollen glands" value="" options="no,yes" propid="91" itemid="138158" categoryid="138157" encounterid="12260414"Swollen glands no.  DERMATOLOGY no" label="New/changing skin lesion" value="" options="no,yes" propid="91" itemid="199126" categoryid="12503" encounterid="12260414"New/changing skin lesion no. no" label="Rash" value="" options="no,yes" propid="91" itemid="12504"  categoryid="12503" encounterid="12260414"Rash no. no" label="Sores" value="" options="" propid="91" itemid="444313" categoryid="12503" encounterid="[id number]"Sores no.     Objective: Vitals: Wt 145., 4, Wt change -.2 lb, Ht 65, Temp 87.2, Pulse sitting 60, BP sitting 100/60.  Past Results: Examination:  General Examination alert, oriented, NAD " label="CONSTITUTIONAL:" categoryPropId="10089" examid="193638"CONSTITUTIONAL: alert, oriented, NAD .  moist, warm" label="SKIN:" categoryPropId="10109" examid="193638"SKIN: moist, warm.  Conjunctiva clear" label="EYES:" categoryPropId="21468" examid="193638"EYES: Conjunctiva clear.  good I:E efffort noted, clear to auscultation bilaterally" label="LUNGS:" categoryPropId="87" examid="193638"LUNGS: good I:E efffort noted, clear to auscultation bilaterally.  regular sinus rhythm" label="HEART:" categoryPropId="86" examid="193638"HEART: regular sinus rhythm.  soft, non-tender/non-distended, bowel sounds present " label="ABDOMEN:" categoryPropId="88" examid="193638"ABDOMEN: soft, non-tender/non-distended, bowel sounds present .  normal external genitalia, labia - unremarkable, vagina - pink moist mucosa, no lesions or abnormal discharge, cervix - no discharge or lesions or CMT, adnexa - no masses or tenderness, uterus - nontender and normal size on palpation " label="FEMALE GENITOURINARY:" categoryPropId="13414" examid="193638"FEMALE GENITOURINARY: normal external genitalia, labia - unremarkable, vagina - pink moist mucosa, no lesions or abnormal discharge, cervix - no discharge or lesions or CMT, adnexa - no masses or tenderness, uterus - nontender and normal size on palpation .  affect normal, good eye contact" label="PSYCH:" categoryPropId="16316" examid="193638"PSYCH: affect normal, good eye contact.  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 Discussed risk of surgery with patient including but not limited to infection/bleeding, damage to bowel,  bladder, ureters and surrounding organs with the need for further surgery. Discussed risk of blood transfusion. Discussed risk of hiv/hep b&c with blood transfusion. Patient is aware of risks and wishes to receive if warranted. Pt. advised no eating or drinking night prior to surgery. Pt. advised to avoid aspirin, aleve, ibuprofen between now and surgery. She is advised if she requires pain management she can take Tylenol. She is advised she will stay in the hospital one night. In order to go home she has to be able to tolerate food, medications by mouth, up and walking around, and able to urinate. No driving for one week. No heavy lifting over 10lbs or sexual intercourse for 6-8 weeks. Pt. advised to stop Premarin cream for the first week following surgery, then can resume twice a week thereafter. High risk HPV infection Notes: She is scheduled for a laproscopic assisted vaginal hysterectomy with bilateral salpingo-oophorectomy for the prevention of recurrent cervical dysplasia on 11/24/2019 at 12:25pm, at the [facility]

## 2019-11-23 ENCOUNTER — Other Ambulatory Visit: Payer: Self-pay

## 2019-11-23 ENCOUNTER — Encounter (HOSPITAL_COMMUNITY)
Admission: RE | Admit: 2019-11-23 | Discharge: 2019-11-23 | Disposition: A | Discharge status: Home / Self Care | Payer: BC Managed Care – PPO | Source: Ambulatory Visit | Attending: Obstetrics and Gynecology | Admitting: Obstetrics and Gynecology

## 2019-11-23 ENCOUNTER — Encounter (HOSPITAL_COMMUNITY): Payer: Self-pay

## 2019-11-23 DIAGNOSIS — Z01812 Encounter for preprocedural laboratory examination: Secondary | ICD-10-CM | POA: Insufficient documentation

## 2019-11-23 HISTORY — DX: Peripheral vascular disease, unspecified: I73.9

## 2019-11-23 LAB — BASIC METABOLIC PANEL
Anion gap: 5 (ref 5–15)
BUN: 20 mg/dL (ref 8–23)
CO2: 28 mmol/L (ref 22–32)
Calcium: 9.1 mg/dL (ref 8.9–10.3)
Chloride: 106 mmol/L (ref 98–111)
Creatinine, Ser: 0.73 mg/dL (ref 0.44–1.00)
GFR calc Af Amer: >60 mL/min (ref >60)
GFR calc non Af Amer: >60 mL/min (ref >60)
Glucose, Bld: 98 mg/dL (ref 70–99)
Potassium: 5.1 mmol/L (ref 3.5–5.1)
Sodium: 139 mmol/L (ref 135–145)

## 2019-11-23 LAB — CBC
HCT: 42.9 % (ref 36.0–46.0)
Hemoglobin: 13.6 g/dL (ref 12.0–15.0)
MCH: 31.3 pg (ref 26.0–34.0)
MCHC: 31.7 g/dL (ref 30.0–36.0)
MCV: 98.8 fL (ref 80.0–100.0)
Platelets: 188 10*3/uL (ref 150–400)
RBC: 4.34 MIL/uL (ref 3.87–5.11)
RDW: 12.3 % (ref 11.5–15.5)
WBC: 5.7 10*3/uL (ref 4.0–10.5)
nRBC: 0 % (ref 0.0–0.2)

## 2019-11-23 LAB — ABO/RH: ABO/RH(D): AB POS

## 2019-11-23 NOTE — Progress Notes (Signed)
PCP - Dr. Unk Pinto Cardiologist - n/a  Chest x-ray - n/a EKG - 08/24/2019 Stress Test - n/a ECHO - n/a Cardiac Cath - n/a  Sleep Study -n/a  CPAP - n/a  Fasting Blood Sugar - n/a Checks Blood Sugar __0___ times a day  Blood Thinner Instructions:n/a Aspirin Instructions:Aspirin 81 mg.  Preventative therapy from Dr. Melford Aase. Last Dose:Stopped a week ago  Anesthesia review:   Patient has a history of Herpes simplex virus 2 infection, Varicose veins, hyperlipidemia, CIN  III (cervical intraepithelial neoplasa node 111 with severe dysplasia).  Patient denies shortness of breath, fever, cough and chest pain at PAT appointment   Patient verbalized understanding of instructions that were given to them at the PAT appointment. Patient was also instructed that they will need to review over the PAT instructions again at home before surgery.

## 2019-11-24 ENCOUNTER — Encounter (HOSPITAL_BASED_OUTPATIENT_CLINIC_OR_DEPARTMENT_OTHER): Payer: Self-pay | Admitting: Obstetrics and Gynecology

## 2019-11-24 ENCOUNTER — Ambulatory Visit (HOSPITAL_BASED_OUTPATIENT_CLINIC_OR_DEPARTMENT_OTHER): Payer: BC Managed Care – PPO | Admitting: Anesthesiology

## 2019-11-24 ENCOUNTER — Observation Stay (HOSPITAL_BASED_OUTPATIENT_CLINIC_OR_DEPARTMENT_OTHER)
Admission: RE | Admit: 2019-11-24 | Discharge: 2019-11-25 | Disposition: A | Discharge status: Home / Self Care | Payer: BC Managed Care – PPO | Attending: Obstetrics and Gynecology | Admitting: Obstetrics and Gynecology

## 2019-11-24 ENCOUNTER — Encounter (HOSPITAL_BASED_OUTPATIENT_CLINIC_OR_DEPARTMENT_OTHER)
Admission: RE | Disposition: A | Discharge status: Home / Self Care | Payer: Self-pay | Source: Home / Self Care | Attending: Obstetrics and Gynecology

## 2019-11-24 ENCOUNTER — Ambulatory Visit (HOSPITAL_BASED_OUTPATIENT_CLINIC_OR_DEPARTMENT_OTHER): Payer: BC Managed Care – PPO | Admitting: Physician Assistant

## 2019-11-24 DIAGNOSIS — R87810 Cervical high risk human papillomavirus (HPV) DNA test positive: Principal | ICD-10-CM | POA: Insufficient documentation

## 2019-11-24 DIAGNOSIS — Z87891 Personal history of nicotine dependence: Secondary | ICD-10-CM | POA: Diagnosis not present

## 2019-11-24 DIAGNOSIS — Z90710 Acquired absence of both cervix and uterus: Secondary | ICD-10-CM | POA: Diagnosis present

## 2019-11-24 DIAGNOSIS — D259 Leiomyoma of uterus, unspecified: Secondary | ICD-10-CM | POA: Diagnosis not present

## 2019-11-24 DIAGNOSIS — Z86001 Personal history of in-situ neoplasm of cervix uteri: Secondary | ICD-10-CM | POA: Insufficient documentation

## 2019-11-24 DIAGNOSIS — N800 Endometriosis of uterus: Secondary | ICD-10-CM | POA: Diagnosis not present

## 2019-11-24 HISTORY — PX: LAPAROSCOPIC VAGINAL HYSTERECTOMY WITH SALPINGO OOPHORECTOMY: SHX6681

## 2019-11-24 LAB — TYPE AND SCREEN
ABO/RH(D): AB POS
Antibody Screen: NEGATIVE

## 2019-11-24 SURGERY — HYSTERECTOMY, VAGINAL, LAPAROSCOPY-ASSISTED, WITH SALPINGO-OOPHORECTOMY
Anesthesia: General | Laterality: Bilateral

## 2019-11-24 MED ORDER — ONDANSETRON HCL 4 MG/2ML IJ SOLN
INTRAMUSCULAR | Status: DC | PRN
  Administered 2019-11-24: 4 mg via INTRAVENOUS

## 2019-11-24 MED ORDER — SODIUM CHLORIDE 0.9 % IR SOLN
Status: DC | PRN
  Administered 2019-11-24: 3000 mL

## 2019-11-24 MED ORDER — CELECOXIB 200 MG PO CAPS
ORAL_CAPSULE | ORAL | Status: AC
  Filled 2019-11-24: qty 2

## 2019-11-24 MED ORDER — VASOPRESSIN 20 UNIT/ML IV SOLN
INTRAVENOUS | Status: DC | PRN
  Administered 2019-11-24: 20 mL via INTRAMUSCULAR

## 2019-11-24 MED ORDER — OXYCODONE HCL 5 MG PO TABS
5.0000 mg | ORAL_TABLET | ORAL | Status: DC | PRN
  Administered 2019-11-24 – 2019-11-25 (×5): 5 mg via ORAL
  Filled 2019-11-24: qty 2

## 2019-11-24 MED ORDER — HYDROMORPHONE HCL 1 MG/ML IJ SOLN
0.2000 mg | INTRAMUSCULAR | Status: DC | PRN
  Filled 2019-11-24: qty 1

## 2019-11-24 MED ORDER — MIDAZOLAM HCL 5 MG/5ML IJ SOLN
INTRAMUSCULAR | Status: DC | PRN
  Administered 2019-11-24: 2 mg via INTRAVENOUS

## 2019-11-24 MED ORDER — LIDOCAINE 20MG/ML (2%) 15 ML SYRINGE OPTIME
INTRAMUSCULAR | Status: DC | PRN
  Administered 2019-11-24: 1.5 mg/kg/h via INTRAVENOUS

## 2019-11-24 MED ORDER — KETOROLAC TROMETHAMINE 15 MG/ML IJ SOLN
15.0000 mg | Freq: Four times a day (QID) | INTRAMUSCULAR | Status: DC | PRN
  Filled 2019-11-24: qty 1

## 2019-11-24 MED ORDER — LACTATED RINGERS IV SOLN
INTRAVENOUS | Status: DC
  Filled 2019-11-24 (×2): qty 1000

## 2019-11-24 MED ORDER — MENTHOL 3 MG MT LOZG
1.0000 | LOZENGE | OROMUCOSAL | Status: DC | PRN
  Filled 2019-11-24: qty 9

## 2019-11-24 MED ORDER — DEXAMETHASONE SODIUM PHOSPHATE 10 MG/ML IJ SOLN
INTRAMUSCULAR | Status: DC | PRN
  Administered 2019-11-24: 10 mg via INTRAVENOUS

## 2019-11-24 MED ORDER — CEFAZOLIN SODIUM-DEXTROSE 2-4 GM/100ML-% IV SOLN
INTRAVENOUS | Status: AC
  Filled 2019-11-24: qty 100

## 2019-11-24 MED ORDER — ONDANSETRON HCL 4 MG PO TABS
4.0000 mg | ORAL_TABLET | Freq: Four times a day (QID) | ORAL | Status: DC | PRN
  Filled 2019-11-24: qty 1

## 2019-11-24 MED ORDER — FENTANYL CITRATE (PF) 100 MCG/2ML IJ SOLN
INTRAMUSCULAR | Status: AC
  Filled 2019-11-24: qty 2

## 2019-11-24 MED ORDER — SCOPOLAMINE 1 MG/3DAYS TD PT72
MEDICATED_PATCH | TRANSDERMAL | Status: AC
  Filled 2019-11-24: qty 1

## 2019-11-24 MED ORDER — PROPOFOL 10 MG/ML IV BOLUS
INTRAVENOUS | Status: DC | PRN
  Administered 2019-11-24: 180 mg via INTRAVENOUS

## 2019-11-24 MED ORDER — ONDANSETRON HCL 4 MG/2ML IJ SOLN
4.0000 mg | Freq: Four times a day (QID) | INTRAMUSCULAR | Status: DC | PRN
  Filled 2019-11-24: qty 2

## 2019-11-24 MED ORDER — FENTANYL CITRATE (PF) 100 MCG/2ML IJ SOLN
25.0000 ug | INTRAMUSCULAR | Status: DC | PRN
  Administered 2019-11-24 (×3): 25 ug via INTRAVENOUS
  Filled 2019-11-24: qty 1

## 2019-11-24 MED ORDER — KETAMINE HCL 10 MG/ML IJ SOLN
INTRAMUSCULAR | Status: AC
  Filled 2019-11-24: qty 1

## 2019-11-24 MED ORDER — ALUM & MAG HYDROXIDE-SIMETH 200-200-20 MG/5ML PO SUSP
30.0000 mL | ORAL | Status: DC | PRN
  Filled 2019-11-24: qty 30

## 2019-11-24 MED ORDER — OXYCODONE HCL 5 MG PO TABS
ORAL_TABLET | ORAL | Status: AC
  Filled 2019-11-24: qty 1

## 2019-11-24 MED ORDER — SENNA 8.6 MG PO TABS
ORAL_TABLET | ORAL | Status: AC
  Filled 2019-11-24: qty 1

## 2019-11-24 MED ORDER — LIDOCAINE 2% (20 MG/ML) 5 ML SYRINGE
INTRAMUSCULAR | Status: AC
  Filled 2019-11-24: qty 5

## 2019-11-24 MED ORDER — ACETAMINOPHEN 500 MG PO TABS
ORAL_TABLET | ORAL | Status: AC
  Filled 2019-11-24: qty 2

## 2019-11-24 MED ORDER — ROCURONIUM BROMIDE 10 MG/ML (PF) SYRINGE
PREFILLED_SYRINGE | INTRAVENOUS | Status: DC | PRN
  Administered 2019-11-24 (×2): 10 mg via INTRAVENOUS
  Administered 2019-11-24: 50 mg via INTRAVENOUS

## 2019-11-24 MED ORDER — FENTANYL CITRATE (PF) 100 MCG/2ML IJ SOLN
INTRAMUSCULAR | Status: DC | PRN
  Administered 2019-11-24 (×2): 50 ug via INTRAVENOUS

## 2019-11-24 MED ORDER — PROMETHAZINE HCL 25 MG/ML IJ SOLN
6.2500 mg | INTRAMUSCULAR | Status: DC | PRN
  Filled 2019-11-24: qty 1

## 2019-11-24 MED ORDER — KETAMINE HCL 10 MG/ML IJ SOLN
INTRAMUSCULAR | Status: DC | PRN
  Administered 2019-11-24: 10 mg via INTRAVENOUS
  Administered 2019-11-24: 30 mg via INTRAVENOUS
  Administered 2019-11-24: 10 mg via INTRAVENOUS

## 2019-11-24 MED ORDER — SCOPOLAMINE 1 MG/3DAYS TD PT72
1.0000 | MEDICATED_PATCH | TRANSDERMAL | Status: DC
  Administered 2019-11-24: 11:00:00 1.5 mg via TRANSDERMAL
  Filled 2019-11-24: qty 1

## 2019-11-24 MED ORDER — SUGAMMADEX SODIUM 200 MG/2ML IV SOLN
INTRAVENOUS | Status: DC | PRN
  Administered 2019-11-24: 150 mg via INTRAVENOUS

## 2019-11-24 MED ORDER — EPHEDRINE 5 MG/ML INJ
INTRAVENOUS | Status: AC
  Filled 2019-11-24: qty 10

## 2019-11-24 MED ORDER — PROPOFOL 10 MG/ML IV BOLUS
INTRAVENOUS | Status: AC
  Filled 2019-11-24: qty 20

## 2019-11-24 MED ORDER — LACTATED RINGERS IV SOLN
INTRAVENOUS | Status: DC
  Filled 2019-11-24: qty 1000

## 2019-11-24 MED ORDER — MIDAZOLAM HCL 2 MG/2ML IJ SOLN
INTRAMUSCULAR | Status: AC
  Filled 2019-11-24: qty 2

## 2019-11-24 MED ORDER — IBUPROFEN 800 MG PO TABS
ORAL_TABLET | ORAL | Status: AC
  Filled 2019-11-24: qty 1

## 2019-11-24 MED ORDER — ACETAMINOPHEN 500 MG PO TABS
1000.0000 mg | ORAL_TABLET | ORAL | Status: AC
  Administered 2019-11-24: 11:00:00 1000 mg via ORAL
  Filled 2019-11-24: qty 2

## 2019-11-24 MED ORDER — IBUPROFEN 800 MG PO TABS
800.0000 mg | ORAL_TABLET | Freq: Three times a day (TID) | ORAL | Status: DC
  Administered 2019-11-24 – 2019-11-25 (×2): 800 mg via ORAL
  Filled 2019-11-24: qty 1

## 2019-11-24 MED ORDER — SIMETHICONE 80 MG PO CHEW
80.0000 mg | CHEWABLE_TABLET | Freq: Four times a day (QID) | ORAL | Status: DC | PRN
  Filled 2019-11-24: qty 1

## 2019-11-24 MED ORDER — CEFAZOLIN SODIUM-DEXTROSE 2-4 GM/100ML-% IV SOLN
2.0000 g | INTRAVENOUS | Status: AC
  Administered 2019-11-24: 2 g via INTRAVENOUS
  Filled 2019-11-24: qty 100

## 2019-11-24 MED ORDER — ONDANSETRON HCL 4 MG/2ML IJ SOLN
INTRAMUSCULAR | Status: AC
  Filled 2019-11-24: qty 2

## 2019-11-24 MED ORDER — CELECOXIB 400 MG PO CAPS
400.0000 mg | ORAL_CAPSULE | ORAL | Status: AC
  Administered 2019-11-24: 11:00:00 400 mg via ORAL
  Filled 2019-11-24: qty 1

## 2019-11-24 MED ORDER — LIDOCAINE-EPINEPHRINE 1 %-1:100000 IJ SOLN
INTRAMUSCULAR | Status: DC | PRN
  Administered 2019-11-24: 20 mL

## 2019-11-24 MED ORDER — LIDOCAINE 2% (20 MG/ML) 5 ML SYRINGE
INTRAMUSCULAR | Status: DC | PRN
  Administered 2019-11-24: 60 mg via INTRAVENOUS

## 2019-11-24 MED ORDER — ACETAMINOPHEN 500 MG PO TABS
1000.0000 mg | ORAL_TABLET | Freq: Three times a day (TID) | ORAL | Status: DC
  Administered 2019-11-24 – 2019-11-25 (×2): 1000 mg via ORAL
  Filled 2019-11-24: qty 2

## 2019-11-24 MED ORDER — EPHEDRINE SULFATE-NACL 50-0.9 MG/10ML-% IV SOSY
PREFILLED_SYRINGE | INTRAVENOUS | Status: DC | PRN
  Administered 2019-11-24: 10 mg via INTRAVENOUS

## 2019-11-24 MED ORDER — BUPIVACAINE HCL (PF) 0.25 % IJ SOLN
INTRAMUSCULAR | Status: DC | PRN
  Administered 2019-11-24: 30 mL

## 2019-11-24 MED ORDER — SENNA 8.6 MG PO TABS
1.0000 | ORAL_TABLET | Freq: Two times a day (BID) | ORAL | Status: DC
  Administered 2019-11-24: 21:00:00 8.6 mg via ORAL
  Filled 2019-11-24: qty 1

## 2019-11-24 MED ORDER — ROCURONIUM BROMIDE 10 MG/ML (PF) SYRINGE
PREFILLED_SYRINGE | INTRAVENOUS | Status: AC
  Filled 2019-11-24: qty 10

## 2019-11-24 MED ORDER — DEXAMETHASONE SODIUM PHOSPHATE 10 MG/ML IJ SOLN
INTRAMUSCULAR | Status: AC
  Filled 2019-11-24: qty 1

## 2019-11-24 SURGICAL SUPPLY — 52 items
ADH SKN CLS APL DERMABOND .7 (GAUZE/BANDAGES/DRESSINGS) ×1
APL SRG 38 LTWT LNG FL B (MISCELLANEOUS) ×1
APPLICATOR ARISTA FLEXITIP XL (MISCELLANEOUS) ×2 IMPLANT
COVER BACK TABLE 60X90IN (DRAPES) ×3 IMPLANT
COVER MAYO STAND STRL (DRAPES) ×3 IMPLANT
DECANTER SPIKE VIAL GLASS SM (MISCELLANEOUS) ×2 IMPLANT
DERMABOND ADVANCED (GAUZE/BANDAGES/DRESSINGS) ×2
DERMABOND ADVANCED .7 DNX12 (GAUZE/BANDAGES/DRESSINGS) ×1 IMPLANT
DILATOR CANAL MILEX (MISCELLANEOUS) ×3 IMPLANT
DRAPE STERI URO 9X17 APER PCH (DRAPES) ×3 IMPLANT
DURAPREP 26ML APPLICATOR (WOUND CARE) ×3 IMPLANT
ELECT REM PT RETURN 9FT ADLT (ELECTROSURGICAL) ×3
ELECTRODE REM PT RTRN 9FT ADLT (ELECTROSURGICAL) IMPLANT
GLOVE BIO SURGEON STRL SZ7 (GLOVE) ×2 IMPLANT
GLOVE BIOGEL M 6.5 STRL (GLOVE) ×8 IMPLANT
GLOVE BIOGEL PI IND STRL 6.5 (GLOVE) ×3 IMPLANT
GLOVE BIOGEL PI IND STRL 7.0 (GLOVE) ×2 IMPLANT
GLOVE BIOGEL PI INDICATOR 6.5 (GLOVE) ×8
GLOVE BIOGEL PI INDICATOR 7.0 (GLOVE) ×8
HEMOSTAT ARISTA ABSORB 3G PWDR (HEMOSTASIS) ×2 IMPLANT
HIBICLENS CHG 4% 4OZ (MISCELLANEOUS) ×2 IMPLANT
IV NS IRRIG 3000ML ARTHROMATIC (IV SOLUTION) ×2 IMPLANT
KIT TURNOVER CYSTO (KITS) ×3 IMPLANT
LEGGING LITHOTOMY PAIR STRL (DRAPES) ×3 IMPLANT
LIGASURE IMPACT 36 18CM CVD LR (INSTRUMENTS) ×2 IMPLANT
LIGASURE VESSEL 5MM BLUNT TIP (ELECTROSURGICAL) IMPLANT
NS IRRIG 1000ML POUR BTL (IV SOLUTION) ×3 IMPLANT
PACK LAVH (CUSTOM PROCEDURE TRAY) ×3 IMPLANT
PACK ROBOTIC GOWN (GOWN DISPOSABLE) ×3 IMPLANT
PACK TRENDGUARD 450 HYBRID PRO (MISCELLANEOUS) IMPLANT
POUCH LAPAROSCOPIC INSTRUMENT (MISCELLANEOUS) ×3 IMPLANT
PROTECTOR NERVE ULNAR (MISCELLANEOUS) ×6 IMPLANT
SET IRRIG TUBING LAPAROSCOPIC (IRRIGATION / IRRIGATOR) ×2 IMPLANT
SET TUBE SMOKE EVAC HIGH FLOW (TUBING) ×3 IMPLANT
SHEARS HARMONIC ACE PLUS 36CM (ENDOMECHANICALS) ×2 IMPLANT
SLEEVE ENDOPATH XCEL 5M (ENDOMECHANICALS) ×6 IMPLANT
SOLUTION ELECTROLUBE (MISCELLANEOUS) ×3 IMPLANT
SPECIMEN JAR MEDIUM (MISCELLANEOUS) ×3 IMPLANT
SUT VIC AB 0 CT1 27 (SUTURE) ×6
SUT VIC AB 0 CT1 27XCR 8 STRN (SUTURE) ×2 IMPLANT
SUT VIC AB 0 CT1 36 (SUTURE) ×6 IMPLANT
SUT VICRYL 1 TIES 12X18 (SUTURE) ×3 IMPLANT
SUT VICRYL 4-0 PS2 18IN ABS (SUTURE) ×6 IMPLANT
TOWEL OR 17X26 10 PK STRL BLUE (TOWEL DISPOSABLE) ×6 IMPLANT
TRAY FOLEY W/BAG SLVR 14FR (SET/KITS/TRAYS/PACK) ×3 IMPLANT
TRENDGUARD 450 HYBRID PRO PACK (MISCELLANEOUS) ×3
TROCAR XCEL NON-BLD 5MMX100MML (ENDOMECHANICALS) ×3 IMPLANT
TUBE CONNECTING 12'X1/4 (SUCTIONS) ×1
TUBE CONNECTING 12X1/4 (SUCTIONS) ×1 IMPLANT
UNDERPAD 30X36 HEAVY ABSORB (UNDERPADS AND DIAPERS) ×3 IMPLANT
WARMER LAPAROSCOPE (MISCELLANEOUS) ×3 IMPLANT
YANKAUER SUCT BULB TIP NO VENT (SUCTIONS) ×2 IMPLANT

## 2019-11-24 NOTE — Anesthesia Procedure Notes (Signed)
Procedure Name: Intubation Date/Time: 11/24/2019 12:33 PM Performed by: Mylee Falin D, CRNA Pre-anesthesia Checklist: Patient identified, Emergency Drugs available, Suction available and Patient being monitored Patient Re-evaluated:Patient Re-evaluated prior to induction Oxygen Delivery Method: Circle system utilized Preoxygenation: Pre-oxygenation with 100% oxygen Induction Type: IV induction Ventilation: Mask ventilation without difficulty Laryngoscope Size: Mac and 3 Grade View: Grade I Tube type: Oral Tube size: 7.0 mm Number of attempts: 1 Airway Equipment and Method: Stylet and Oral airway Placement Confirmation: ETT inserted through vocal cords under direct vision,  positive ETCO2 and breath sounds checked- equal and bilateral Secured at: 21 cm Tube secured with: Tape Dental Injury: Teeth and Oropharynx as per pre-operative assessment

## 2019-11-24 NOTE — Op Note (Signed)
11/24/2019  2:39 PM  PATIENT:  Lauren Fry  64 y.o. female  PRE-OPERATIVE DIAGNOSIS:  B97.7 High risk HPV infection N87.9 cervical dysplasia D06.0 Severe dysplasia of cervix  POST-OPERATIVE DIAGNOSIS:  B97.7 High risk HPV infection N87.9 cervical dysplasia D06.0 Severe dysplasia of cervix  PROCEDURE:  Procedure(s): LAPAROSCOPIC ASSISTED VAGINAL HYSTERECTOMY WITH SALPINGO OOPHORECTOMY (Bilateral)  SURGEON:  Surgeon(s) and Role:    Christophe Louis, MD - Primary    * Thurnell Lose, MD - Assisting  PHYSICIAN ASSISTANT:   ASSISTANTS: Dr. Thurnell Lose assisted due to the complexity of the surgery    ANESTHESIA:   general  EBL:  150 mL   BLOOD ADMINISTERED:none  DRAINS: Urinary Catheter (Foley)   LOCAL MEDICATIONS USED:  MARCAINE    and LIDOCAINE   SPECIMEN:  Source of Specimen:  uterus cervix and bilateral fallopian tubes and ovaries   DISPOSITION OF SPECIMEN:  PATHOLOGY  COUNTS:  YES  TOURNIQUET:  * No tourniquets in log *  DICTATION: .Dragon Dictation  PLAN OF CARE: Admit for overnight observation  PATIENT DISPOSITION:  PACU - hemodynamically stable.   Delay start of Pharmacological VTE agent (>24hrs) due to surgical blood loss or risk of bleeding: not applicable  Findings: normal external genitalia/ atrophic appearing vaginal mucosa and cervix... Normal appearing uterus fallopian tubes and ovaries.   Procedure: The patient was taken to the operating room  And placed under general anesthesia.  She was Prepped and draped in the normal sterile fashion. Time out was performed.  A foley catheter was placed. A uterine manipulator was placed. Attention was turned to the abdomen where the umbilicus was injected with 10 cc of marcaine. A 5 mm trocar was placed under direct visualization. Pneumoperitoneum was achieved with C02 gas... A 5 mm trocar was placed in the right and left lower quadrants. Each trocar site was injected with 10 cc of marcaine prior to trocar  placement.  . The left ureter was identified. The left infundibulopelvic ligament was cauterized and transected with the harmonic scalpel. Followed by the broad ligament and the round ligament. This was repeated on the right side.  Attention was then turned to the vagina. A weighted speculum was placed in to the vagina and the cervix was grasped with a toothed tenaculum. The cervix was then injected circumferentially with 1% xylocaine with 1:100K of epinephrine and vasopression The cervix was then circumferentially incised with the scalpel and the bladder was dissected off the pubovesical cervical fascia. The anterior cul-de-sac as entered sharply. The same procedure was performed posteriorly and the posterior cu-lde-sac was entered sharply without difficulty. A Heaney clamp was placed over the uterosacral ligaments bilaterally., These were transected and suture ligated with 0 vicryl. The cardinal ligaments were then grasped with the ligasure cauterized and transected.  The uterine arteries Then clamped with ligasure cauterized and , transected .Excellent hemostasis was visualized. The uterus cervix bilateral fallopian tubes and ovaries were then removed.  The vaginal cuff angles were closed with an angle suture of 0 vicryl and transfixed to the ipsilateral uterosacral ligaments. The remainder of the vaginal cuff was closed with 0 vicryl in a running locked fashion. All instruments were removed from the vagina. Attention was turned to the abdomen were pneumoperitoneum was reestablished. The pelvis was irrigated. Excellent hemostasis was noted. Arista was placed along the pelvic side walls and vaginal cuff  To help maintain hemostasis.  All trocars were removed under direct visualization . The pneumoperitoneum was released. The skin incisions were closed  with 4-0 vicryl and dermabond.  The patient was taken to the recovery room awake and in stable condition.  Sponge lap and needle counts were correct times 2.

## 2019-11-24 NOTE — Transfer of Care (Signed)
Immediate Anesthesia Transfer of Care Note  Patient: Lauren Fry  Procedure(s) Performed: LAPAROSCOPIC ASSISTED VAGINAL HYSTERECTOMY WITH SALPINGO OOPHORECTOMY (Bilateral )  Patient Location: PACU  Anesthesia Type:General  Level of Consciousness: awake, alert  and oriented  Airway & Oxygen Therapy: Patient Spontanous Breathing and Patient connected to nasal cannula oxygen  Post-op Assessment: Report given to RN and Post -op Vital signs reviewed and stable  Post vital signs: Reviewed and stable  Last Vitals:  Vitals Value Taken Time  BP 125/66 11/24/19 1449  Temp    Pulse 69 11/24/19 1451  Resp 14 11/24/19 1451  SpO2 99 % 11/24/19 1451  Vitals shown include unvalidated device data.  Last Pain:  Vitals:   11/24/19 1039  TempSrc: Oral  PainSc: 0-No pain      Patients Stated Pain Goal: 5 ([id number])  Complications: No apparent anesthesia complications

## 2019-11-24 NOTE — Discharge Instructions (Signed)
Laparoscopically Assisted Vaginal Hysterectomy, Care After This sheet gives you information about how to care for yourself after your procedure. Your health care provider may also give you more specific instructions. If you have problems or questions, contact your health care provider. What can I expect after the procedure? After the procedure, it is common to have:  Soreness and numbness in your incision areas.  Abdominal pain. You will be given pain medicine to control it.  Vaginal bleeding and discharge. You will need to use a sanitary napkin after this procedure.  Sore throat from the breathing tube that was inserted during surgery. Follow these instructions at home: Medicines  Take over-the-counter and prescription medicines only as told by your health care provider.  Do not take aspirin or ibuprofen. These medicines can cause bleeding.  Do not drive or use heavy machinery while taking prescription pain medicine.  Do not drive for 24 hours if you were given a medicine to help you relax (sedative) during the procedure. Incision care   Follow instructions from your health care provider about how to take care of your incisions. Make sure you: ? Wash your hands with soap and water before you change your bandage (dressing). If soap and water are not available, use hand sanitizer. ? Change your dressing as told by your health care provider. ? Leave stitches (sutures), skin glue, or adhesive strips in place. These skin closures may need to stay in place for 2 weeks or longer. If adhesive strip edges start to loosen and curl up, you may trim the loose edges. Do not remove adhesive strips completely unless your health care provider tells you to do that.  Check your incision area every day for signs of infection. Check for: ? Redness, swelling, or pain. ? Fluid or blood. ? Warmth. ? Pus or a bad smell. Activity  Get regular exercise as told by your health care provider. You may be  told to take short walks every day and go farther each time.  Return to your normal activities as told by your health care provider. Ask your health care provider what activities are safe for you.  Do not douche, use tampons, or have sexual intercourse for at least 6 weeks, or until your health care provider gives you permission.  Do not lift anything that is heavier than 10 lb (4.5 kg), or the limit that your health care provider tells you, until he or she says that it is safe. General instructions  Do not take baths, swim, or use a hot tub until your health care provider approves. Take showers instead of baths.  Do not drive for 24 hours if you received a sedative.  Do not drive or operate heavy machinery while taking prescription pain medicine.  To prevent or treat constipation while you are taking prescription pain medicine, your health care provider may recommend that you: ? Drink enough fluid to keep your urine clear or pale yellow. ? Take over-the-counter or prescription medicines. ? Eat foods that are high in fiber, such as fresh fruits and vegetables, whole grains, and beans. ? Limit foods that are high in fat and processed sugars, such as fried and sweet foods.  Keep all follow-up visits as told by your health care provider. This is important. Contact a health care provider if:  You have signs of infection, such as: ? Redness, swelling, or pain around your incision sites. ? Fluid or blood coming from an incision. ? An incision that feels warm to the   touch. ? Pus or a bad smell coming from an incision.  Your incision breaks open.  Your pain medicine is not helping.  You feel dizzy or light-headed.  You have pain or bleeding when you urinate.  You have persistent nausea and vomiting.  You have blood, pus, or a bad-smelling discharge from your vagina. Get help right away if:  You have a fever.  You have severe abdominal pain.  You have chest pain.  You have  shortness of breath.  You faint.  You have pain, swelling, or redness in your leg.  You have heavy bleeding from your vagina. Summary  After the procedure, it is common to have abdominal pain and vaginal bleeding.  You should not drive or lift heavy objects until your health care provider says that it is safe.  Contact your health care provider if you have any symptoms of infection, excessive vaginal bleeding, nausea, vomiting, or shortness of breath. This information is not intended to replace advice given to you by your health care provider. Make sure you discuss any questions you have with your health care provider. Document Revised: 10/03/2017 Document Reviewed: 12/17/2016 Elsevier Patient Education  2020 [employer]  

## 2019-11-24 NOTE — H&P (Signed)
Date of Initial H&P: 11/22/2019 History reviewed, patient examined, no change in status, stable for surgery.

## 2019-11-24 NOTE — Anesthesia Preprocedure Evaluation (Signed)
Anesthesia Evaluation  Patient identified by MRN, date of birth, ID band Patient awake    Reviewed: Allergy & Precautions, NPO status , Patient's Chart, lab work & pertinent test results  History of Anesthesia Complications (+) PONV  Airway Mallampati: II  TM Distance: >3 FB Neck ROM: Full    Dental  (+) Dental Advisory Given   Pulmonary former smoker,    breath sounds clear to auscultation       Cardiovascular negative cardio ROS   Rhythm:Regular Rate:Normal     Neuro/Psych negative neurological ROS     GI/Hepatic negative GI ROS, Neg liver ROS,   Endo/Other  negative endocrine ROS  Renal/GU negative Renal ROS     Musculoskeletal   Abdominal   Peds  Hematology negative hematology ROS (+)   Anesthesia Other Findings   Reproductive/Obstetrics                            Lab Results  Component Value Date   WBC 5.7 11/23/2019   HGB 13.6 11/23/2019   HCT 42.9 11/23/2019   MCV 98.8 11/23/2019   PLT 188 11/23/2019   Lab Results  Component Value Date   CREATININE 0.73 11/23/2019   BUN 20 11/23/2019   NA 139 11/23/2019   K 5.1 11/23/2019   CL 106 11/23/2019   CO2 28 11/23/2019    Anesthesia Physical Anesthesia Plan  ASA: I  Anesthesia Plan: General   Post-op Pain Management:    Induction: Intravenous  PONV Risk Score and Plan: 4 or greater and Dexamethasone, Ondansetron, Treatment may vary due to age or medical condition, Scopolamine patch - Pre-op and Midazolam  Airway Management Planned: Oral ETT  Additional Equipment: None  Intra-op Plan:   Post-operative Plan: Extubation in OR  Informed Consent: I have reviewed the patients History and Physical, chart, labs and discussed the procedure including the risks, benefits and alternatives for the proposed anesthesia with the patient or authorized representative who has indicated his/her understanding and acceptance.      Dental advisory given  Plan Discussed with: CRNA  Anesthesia Plan Comments:         Anesthesia Quick Evaluation

## 2019-11-25 DIAGNOSIS — R87810 Cervical high risk human papillomavirus (HPV) DNA test positive: Secondary | ICD-10-CM | POA: Diagnosis not present

## 2019-11-25 LAB — CBC
HCT: 35.4 % — ABNORMAL LOW (ref 36.0–46.0)
Hemoglobin: 11.5 g/dL — ABNORMAL LOW (ref 12.0–15.0)
MCH: 31.9 pg (ref 26.0–34.0)
MCHC: 32.5 g/dL (ref 30.0–36.0)
MCV: 98.3 fL (ref 80.0–100.0)
Platelets: 152 10*3/uL (ref 150–400)
RBC: 3.6 MIL/uL — ABNORMAL LOW (ref 3.87–5.11)
RDW: 12.3 % (ref 11.5–15.5)
WBC: 8.4 10*3/uL (ref 4.0–10.5)
nRBC: 0 % (ref 0.0–0.2)

## 2019-11-25 MED ORDER — IBUPROFEN 800 MG PO TABS: 800.0000 mg | ORAL_TABLET | Freq: Three times a day (TID) | ORAL | 0 refills | Status: DC

## 2019-11-25 MED ORDER — IBUPROFEN 800 MG PO TABS
ORAL_TABLET | ORAL | Status: AC
  Filled 2019-11-25: qty 1

## 2019-11-25 MED ORDER — OXYCODONE HCL 5 MG PO TABS
ORAL_TABLET | ORAL | Status: AC
  Filled 2019-11-25: qty 1

## 2019-11-25 MED ORDER — OXYCODONE HCL 5 MG PO TABS: 5.0000 mg | ORAL_TABLET | ORAL | 0 refills | Status: DC | PRN

## 2019-11-25 MED ORDER — ACETAMINOPHEN 500 MG PO TABS
ORAL_TABLET | ORAL | Status: AC
  Filled 2019-11-25: qty 2

## 2019-11-25 NOTE — Anesthesia Postprocedure Evaluation (Signed)
Anesthesia Post Note  Patient: BLONNIE CONSTANTINIDES  Procedure(s) Performed: LAPAROSCOPIC ASSISTED VAGINAL HYSTERECTOMY WITH SALPINGO OOPHORECTOMY (Bilateral )     Patient location during evaluation: PACU Anesthesia Type: General Level of consciousness: awake and alert Pain management: pain level controlled Vital Signs Assessment: post-procedure vital signs reviewed and stable Respiratory status: spontaneous breathing, nonlabored ventilation, respiratory function stable and patient connected to nasal cannula oxygen Cardiovascular status: blood pressure returned to baseline and stable Postop Assessment: no apparent nausea or vomiting Anesthetic complications: no    Last Vitals:  Vitals:   11/25/19 0940 11/25/19 0941  BP: (!) 88/53 (!) 98/56  Pulse: 61   Resp: 15   Temp: 37 C   SpO2: 94%     Last Pain:  Vitals:   11/25/19 0548  TempSrc:   PainSc: 2                  Tiajuana Amass

## 2019-11-25 NOTE — Discharge Summary (Signed)
Physician Discharge Summary  Patient ID: Lauren Fry MRN: [id number] DOB/AGE: October 21, 1956 64 y.o.  Admit date: 11/24/2019 Discharge date: 11/25/2019  Admission Diagnoses:  Discharge Diagnoses:  Active Problems:   S/P laparoscopic assisted vaginal hysterectomy (LAVH)   Discharged Condition: good  Hospital Course: Routine post op care.  Uncomplicated recovery overnight.  Consults: None  Significant Diagnostic Studies: labs:  CBC Latest Ref Rng & Units 11/25/2019 11/23/2019 08/24/2019  WBC 4.0 - 10.5 K/uL 8.4 5.7 7.2  Hemoglobin 12.0 - 15.0 g/dL 11.5(L) 13.6 13.4  Hematocrit 36.0 - 46.0 % 35.4(L) 42.9 39.5  Platelets 150 - 400 K/uL 152 188 248    Treatments: surgery: LAVH/BSO  Discharge Exam: Blood pressure (!) 92/55, pulse 63, temperature 98 F (36.7 C), temperature source Oral, resp. rate 16, height 5\' 7"  (1.702 m), weight 64.9 kg, SpO2 94 %. Gen: NAD, A&O x 3  Abd:  Soft, nontender.  Incisions with dermabond.  Tympanic. (Pt passing flatus) Ext:  No edema or calf tenderness.  Disposition: Discharge disposition: 01-Home or Self Care       Discharge Instructions    Call MD for:  extreme fatigue   Complete by: As directed    Call MD for:  extreme fatigue   Complete by: As directed    Call MD for:  persistant dizziness or light-headedness   Complete by: As directed    Call MD for:  persistant dizziness or light-headedness   Complete by: As directed    Call MD for:  persistant nausea and vomiting   Complete by: As directed    Call MD for:  persistant nausea and vomiting   Complete by: As directed    Call MD for:  redness, tenderness, or signs of infection (pain, swelling, redness, odor or green/yellow discharge around incision site)   Complete by: As directed    Call MD for:  redness, tenderness, or signs of infection (pain, swelling, redness, odor or green/yellow discharge around incision site)   Complete by: As directed    Call MD for:  severe uncontrolled pain    Complete by: As directed    Call MD for:  severe uncontrolled pain   Complete by: As directed    Call MD for:  temperature >100.4   Complete by: As directed    Call MD for:  temperature >100.4   Complete by: As directed    Diet - low sodium heart healthy   Complete by: As directed    Discharge wound care:   Complete by: As directed    Keep incisions clean and dry.   Discharge wound care:   Complete by: As directed    Keep incisions clean and dry.   Increase activity slowly   Complete by: As directed    Increase activity slowly   Complete by: As directed    Lifting restrictions   Complete by: As directed    No heavy lifting, pushing or pulling greater than 15 pounds.   Lifting restrictions   Complete by: As directed    No heavy lifting, pushing or pulling.   Sexual Activity Restrictions   Complete by: As directed    Pelvic rest x 6 weeks.   Sexual Activity Restrictions   Complete by: As directed    Pelvic rest x 6 weeks.     Allergies as of 11/25/2019   No Known Allergies     Medication List    TAKE these medications   aspirin 81 MG chewable tablet Chew 81 mg  by mouth daily.   citalopram 40 MG tablet Commonly known as: CELEXA TAKE 1 TABLET DAILY FOR MOOD What changed:   how much to take  how to take this  when to take this  additional instructions   conjugated estrogens vaginal cream Commonly known as: Premarin 1 gram per vagina daily for 2 weeks then 2 times a week What changed:   how much to take  how to take this  when to take this  additional instructions   famciclovir 500 MG tablet Commonly known as: FAMVIR Take 1 tablet 2 x /day What changed:   how much to take  how to take this  when to take this  additional instructions   ibuprofen 800 MG tablet Commonly known as: ADVIL Take 1 tablet (800 mg total) by mouth every 8 (eight) hours.   Milk Thistle 250 MG Caps Take 1 capsule (250 mg total) by mouth daily.   oxyCODONE 5 MG  immediate release tablet Commonly known as: Oxy IR/ROXICODONE Take 1 tablet (5 mg total) by mouth every 4 (four) hours as needed for severe pain or breakthrough pain.   vitamin C 500 MG tablet Commonly known as: ASCORBIC ACID Take 500 mg by mouth daily.   VITAMIN D PO Take 5,000 Units by mouth daily.   vitamin E 180 MG (400 UNITS) capsule Take 400 Units by mouth daily.   zinc gluconate 50 MG tablet Take 50 mg by mouth daily.            Discharge Care Instructions  (From admission, onward)         Start     Ordered   11/25/19 0000  Discharge wound care:    Comments: Keep incisions clean and dry.   11/25/19 0857   11/25/19 0000  Discharge wound care:    Comments: Keep incisions clean and dry.   11/25/19 0901         Follow-up Information    Christophe Louis, MD. Schedule an appointment as soon as possible for a visit in 2 week(s).   Specialty: Obstetrics and Gynecology Why: Post op follow up Contact information: [address] [place] [address] [phone]           Signed: Thurnell Lose 11/25/2019, 9:01 AM

## 2019-11-26 LAB — SURGICAL PATHOLOGY

## 2019-12-02 ENCOUNTER — Ambulatory Visit: Payer: Medicare Other

## 2019-12-11 ENCOUNTER — Ambulatory Visit: Payer: BC Managed Care – PPO | Attending: Internal Medicine

## 2019-12-11 DIAGNOSIS — Z23 Encounter for immunization: Secondary | ICD-10-CM | POA: Insufficient documentation

## 2019-12-11 NOTE — Progress Notes (Signed)
   Covid-19 Vaccination Clinic  Name:  Lauren Fry    MRN: [id number] DOB: 02/15/1956  12/11/2019  Lauren Fry was observed post Covid-19 immunization for 15 minutes without incidence. She was provided with Vaccine Information Sheet and instruction to access the V-Safe system.   Lauren Fry was instructed to call 911 with any severe reactions post vaccine: Marland Kitchen Difficulty breathing  . Swelling of your face and throat  . A fast heartbeat  . A bad rash all over your body  . Dizziness and weakness    Immunizations Administered    Name Date Dose VIS Date Route   Pfizer COVID-19 Vaccine 12/11/2019 11:49 AM 0.3 mL 10/15/2019 Intramuscular   Manufacturer: [place]   Lot: [id number]   [place]: [id number]

## 2019-12-28 ENCOUNTER — Other Ambulatory Visit: Payer: Self-pay | Admitting: Obstetrics and Gynecology

## 2019-12-28 DIAGNOSIS — Z1231 Encounter for screening mammogram for malignant neoplasm of breast: Secondary | ICD-10-CM

## 2019-12-29 ENCOUNTER — Ambulatory Visit
Admission: RE | Admit: 2019-12-29 | Discharge: 2019-12-29 | Disposition: A | Discharge status: Home / Self Care | Payer: BC Managed Care – PPO | Source: Ambulatory Visit

## 2019-12-29 ENCOUNTER — Other Ambulatory Visit: Payer: Self-pay

## 2019-12-29 DIAGNOSIS — Z1231 Encounter for screening mammogram for malignant neoplasm of breast: Secondary | ICD-10-CM

## 2019-12-31 ENCOUNTER — Other Ambulatory Visit: Payer: Self-pay | Admitting: Obstetrics and Gynecology

## 2019-12-31 DIAGNOSIS — R928 Other abnormal and inconclusive findings on diagnostic imaging of breast: Secondary | ICD-10-CM

## 2020-01-05 ENCOUNTER — Ambulatory Visit: Payer: BC Managed Care – PPO | Attending: Internal Medicine

## 2020-01-05 DIAGNOSIS — Z23 Encounter for immunization: Secondary | ICD-10-CM | POA: Insufficient documentation

## 2020-01-05 NOTE — Progress Notes (Signed)
   Covid-19 Vaccination Clinic  Name:  Lauren Fry    MRN: [id number] DOB: 09-20-1956  01/05/2020  [name] was observed post Covid-19 immunization for 15 minutes without incident. She was provided with Vaccine Information Sheet and instruction to access the V-Safe system.   [name] was instructed to call 911 with any severe reactions post vaccine: Marland Kitchen Difficulty breathing  . Swelling of face and throat  . A fast heartbeat  . A bad rash all over body  . Dizziness and weakness   Immunizations Administered    Name Date Dose VIS Date Route   Pfizer COVID-19 Vaccine 01/05/2020  8:46 AM 0.3 mL 10/15/2019 Intramuscular   Manufacturer: [place]   Lot: [id number]   [place]: [id number]

## 2020-01-13 ENCOUNTER — Other Ambulatory Visit: Payer: Self-pay | Admitting: Obstetrics and Gynecology

## 2020-01-13 ENCOUNTER — Ambulatory Visit
Admission: RE | Admit: 2020-01-13 | Discharge: 2020-01-13 | Disposition: A | Discharge status: Home / Self Care | Payer: BC Managed Care – PPO | Source: Ambulatory Visit | Attending: Obstetrics and Gynecology | Admitting: Obstetrics and Gynecology

## 2020-01-13 ENCOUNTER — Other Ambulatory Visit: Payer: Self-pay

## 2020-01-13 DIAGNOSIS — R928 Other abnormal and inconclusive findings on diagnostic imaging of breast: Secondary | ICD-10-CM

## 2020-01-18 ENCOUNTER — Ambulatory Visit
Admission: RE | Admit: 2020-01-18 | Discharge: 2020-01-18 | Disposition: A | Discharge status: Home / Self Care | Payer: BC Managed Care – PPO | Source: Ambulatory Visit | Attending: Obstetrics and Gynecology | Admitting: Obstetrics and Gynecology

## 2020-01-18 ENCOUNTER — Other Ambulatory Visit: Payer: Self-pay

## 2020-01-18 DIAGNOSIS — R928 Other abnormal and inconclusive findings on diagnostic imaging of breast: Secondary | ICD-10-CM

## 2020-01-18 HISTORY — PX: BREAST BIOPSY: SHX20

## 2020-05-29 ENCOUNTER — Other Ambulatory Visit: Payer: Self-pay | Admitting: Internal Medicine

## 2020-05-29 MED ORDER — SCOPOLAMINE 1 MG/3DAYS TD PT72: MEDICATED_PATCH | TRANSDERMAL | 0 refills | Status: AC

## 2020-09-26 ENCOUNTER — Encounter: Payer: Self-pay | Admitting: Internal Medicine

## 2020-09-26 NOTE — Progress Notes (Signed)
R  E  S  C  H  E  D  U  L  E   D                                                                                                                                                                                      This very nice 64 y.o.  MWF presents for a  comprehensive evaluation and management of multiple medical co-morbidities.  Patient has been followed expectantly for elevated BP , HLD, Prediabetes  and Vitamin D Deficiency.       Patient's BP has been monitored expectantly and patient denies any cardiac symptoms as chest pain, palpitations, shortness of breath, dizziness or ankle swelling. Today's         Patient's hyperlipidemia is controlled with diet and medications. Patient denies myalgias or other medication SE's. Last lipids were not at goal:  Lab Results  Component Value Date   CHOL 194 08/24/2019   HDL 52 08/24/2019   LDLCALC 114 (H) 08/24/2019   TRIG 166 (H) 08/24/2019   CHOLHDL 3.7 08/24/2019        Patient has hx/o prediabetes (A1c 5.7% /2011) and patient denies reactive hypoglycemic symptoms, visual blurring, diabetic polys or paresthesias. Last A1c was normal & at goal:  Lab Results  Component Value Date   HGBA1C 5.1 08/24/2019        Finally, patient has history of Vitamin D Deficiency and last Vitamin D was at goal:  Lab Results  Component Value Date   VD25OH 76 08/24/2019    Current Outpatient Medications on File Prior to Visit  Medication Sig  . aspirin 81 MG chewable tablet Chew 81 mg by mouth daily.  . Cholecalciferol (VITAMIN D PO) Take 5,000 Units by mouth daily.   . citalopram (CELEXA) 40 MG tablet TAKE 1 TABLET DAILY FOR MOOD (Patient taking differently: Take 40 mg by mouth daily. )  . conjugated estrogens (PREMARIN) vaginal cream 1 gram per vagina daily for 2 weeks  then 2 times a week (Patient taking differently: Place 1 Applicatorful vaginally 2 (two) times a week. )  . famciclovir (FAMVIR) 500 MG tablet Take 1 tablet 2 x /day (Patient taking differently: Take 500 mg by mouth daily. )  . ibuprofen (ADVIL) 800 MG tablet Take 1 tablet (800 mg total) by mouth every 8 (eight) hours.  . Milk Thistle 250 MG CAPS Take 1 capsule (250 mg total) by mouth daily.  . vitamin C (ASCORBIC ACID) 500 MG tablet Take 500 mg by mouth daily.  . vitamin E 400 UNIT capsule Take 400 Units by mouth daily.  Marland Kitchen zinc gluconate 50  MG tablet Take 50 mg by mouth daily.     No Known Allergies  Past Medical History:  Diagnosis Date  . Anxiety   . Breast mass, right   . HSV-2 (herpes simplex virus 2) infection   . Hyperlipidemia   . Peripheral vascular disease (HCC)    varicose veins  . Unspecified vitamin D deficiency   . Varicose veins    Health Maintenance  Topic Date Due  . INFLUENZA VACCINE  06/04/2020  . PNA vac Low Risk Adult (2 of 2 - PPSV23) 08/23/2020  . MAMMOGRAM  12/28/2020  . COLONOSCOPY  04/30/2023  . TETANUS/TDAP  03/09/2024  . DEXA SCAN  Completed  . COVID-19 Vaccine  Completed  . Hepatitis C Screening  Completed   Immunization History  Administered Date(s) Administered  . Influenza Inj Mdck Quad With Preservative 11/13/2018  . Influenza, High Dose Seasonal PF 08/24/2019  . Influenza,inj,Quad PF,6+ Mos 11/18/2017  . Influenza-Unspecified 07/06/2018  . PFIZER SARS-COV-2 Vaccination 12/11/2019, 01/05/2020  . PPD Test 03/09/2014, 03/13/2015, 03/21/2016, 04/22/2017, 07/16/2018  . Pneumococcal Conjugate-13 08/24/2019  . Tdap 03/09/2014  . Zoster 08/26/2014    Last Colon - 04/29/2018 - Dr Watt Climes - recc 10 yr f/u- due July 2029  Last MGM - 01/13/20/ and 01/18/2020

## 2020-09-27 ENCOUNTER — Ambulatory Visit: Payer: PRIVATE HEALTH INSURANCE | Admitting: Internal Medicine

## 2020-09-27 ENCOUNTER — Encounter: Payer: BLUE CROSS/BLUE SHIELD | Admitting: Internal Medicine

## 2020-09-27 ENCOUNTER — Encounter: Payer: Self-pay | Admitting: Internal Medicine

## 2020-09-27 ENCOUNTER — Other Ambulatory Visit: Payer: Self-pay

## 2020-09-27 DIAGNOSIS — Z87891 Personal history of nicotine dependence: Secondary | ICD-10-CM

## 2020-09-27 DIAGNOSIS — Z79899 Other long term (current) drug therapy: Secondary | ICD-10-CM

## 2020-09-27 DIAGNOSIS — R7309 Other abnormal glucose: Secondary | ICD-10-CM

## 2020-09-27 DIAGNOSIS — Z1211 Encounter for screening for malignant neoplasm of colon: Secondary | ICD-10-CM

## 2020-09-27 DIAGNOSIS — E782 Mixed hyperlipidemia: Secondary | ICD-10-CM

## 2020-09-27 DIAGNOSIS — Z136 Encounter for screening for cardiovascular disorders: Secondary | ICD-10-CM

## 2020-09-27 DIAGNOSIS — R7303 Prediabetes: Secondary | ICD-10-CM

## 2020-09-27 DIAGNOSIS — E559 Vitamin D deficiency, unspecified: Secondary | ICD-10-CM

## 2020-09-27 DIAGNOSIS — R0989 Other specified symptoms and signs involving the circulatory and respiratory systems: Secondary | ICD-10-CM

## 2020-09-27 DIAGNOSIS — R69 Illness, unspecified: Secondary | ICD-10-CM

## 2020-10-17 ENCOUNTER — Other Ambulatory Visit: Payer: Self-pay

## 2020-10-17 ENCOUNTER — Ambulatory Visit (INDEPENDENT_AMBULATORY_CARE_PROVIDER_SITE_OTHER): Payer: PRIVATE HEALTH INSURANCE | Admitting: Adult Health Nurse Practitioner

## 2020-10-17 ENCOUNTER — Encounter: Payer: Self-pay | Admitting: Adult Health Nurse Practitioner

## 2020-10-17 VITALS — BP 122/76 | HR 64 | Temp 98.1°F | Ht 66.5 in | Wt 147.0 lb

## 2020-10-17 DIAGNOSIS — R6889 Other general symptoms and signs: Secondary | ICD-10-CM | POA: Diagnosis not present

## 2020-10-17 DIAGNOSIS — E559 Vitamin D deficiency, unspecified: Secondary | ICD-10-CM | POA: Diagnosis not present

## 2020-10-17 DIAGNOSIS — K120 Recurrent oral aphthae: Secondary | ICD-10-CM

## 2020-10-17 DIAGNOSIS — Z23 Encounter for immunization: Secondary | ICD-10-CM

## 2020-10-17 DIAGNOSIS — I10 Essential (primary) hypertension: Secondary | ICD-10-CM

## 2020-10-17 DIAGNOSIS — Z136 Encounter for screening for cardiovascular disorders: Secondary | ICD-10-CM | POA: Diagnosis not present

## 2020-10-17 DIAGNOSIS — Z87891 Personal history of nicotine dependence: Secondary | ICD-10-CM

## 2020-10-17 DIAGNOSIS — R300 Dysuria: Secondary | ICD-10-CM

## 2020-10-17 DIAGNOSIS — Z79899 Other long term (current) drug therapy: Secondary | ICD-10-CM

## 2020-10-17 DIAGNOSIS — Z0001 Encounter for general adult medical examination with abnormal findings: Secondary | ICD-10-CM

## 2020-10-17 DIAGNOSIS — E782 Mixed hyperlipidemia: Secondary | ICD-10-CM | POA: Diagnosis not present

## 2020-10-17 DIAGNOSIS — F419 Anxiety disorder, unspecified: Secondary | ICD-10-CM

## 2020-10-17 DIAGNOSIS — R0989 Other specified symptoms and signs involving the circulatory and respiratory systems: Secondary | ICD-10-CM

## 2020-10-17 DIAGNOSIS — R7309 Other abnormal glucose: Secondary | ICD-10-CM

## 2020-10-17 DIAGNOSIS — Z0000 Encounter for general adult medical examination without abnormal findings: Secondary | ICD-10-CM

## 2020-10-17 MED ORDER — FAMCICLOVIR 500 MG PO TABS
ORAL_TABLET | ORAL | 3 refills | Status: DC
Start: 2020-10-17 — End: 2021-10-17

## 2020-10-17 NOTE — Progress Notes (Signed)
MEDICARE ANNUAL WELLNESS VISIT AND FOLLOW UP  Assessment:   Lauren Fry was seen today for annual exam.  Diagnoses and all orders for this visit:  Encounter for Medicare annual wellness exam -     EKG 12-Lead  Labile hypertension Controlled with lifestyle Monitor blood pressure at home; call if consistently over 130/80 Continue DASH diet.   Reminder to go to the ER if any CP, SOB, nausea, dizziness, severe HA, changes vision/speech, left arm numbness and tingling and jaw pain. -     CBC with Differential/Platelet -     COMPLETE METABOLIC PANEL WITH GFR -     Urinalysis w microscopic + reflex cultur  Hyperlipidemia, mixed Continue medications: Discussed dietary and exercise modifications Low fat diet -     Lipid panel  Abnormal glucose Discussed dietary and exercise modifications  Vitamin D deficiency Continue supplementation to maintain goal of 70-100 Taking Vitamin D 5,000 IU daily To goal Defer vitamin D level  Former smoker  Medication management Continued  Need for pneumococcal vaccination -     Pneumococcal polysaccharide vaccine 23-valent greater than or equal to 2yo subcutaneous/IM  Aphthous stomatitis Doing well at this time -     famciclovir (FAMVIR) 500 MG tablet; Take 1 tablet 2 x /day  Anxiety Doing well on current regiment  Continue Celexa 40mg  Discussed stress management techniques   Discussed good sleep hygiene Discussed increasing physical activity and exercise Increase water intake    Over 40 minutes of face to face interview, exam, counseling, chart review and critical decision making was performed  Future Appointments  Date Time Provider [place]  10/17/2021  2:00 PM Garnet Sierras, NP [facility] None     Plan:   During the course of the visit the patient was educated and counseled about appropriate screening and preventive services including:    Pneumococcal vaccine   Prevnar 13  Influenza vaccine  Td  vaccine  Screening electrocardiogram  Bone densitometry screening  Colorectal cancer screening  Diabetes screening  Glaucoma screening  Nutrition counseling   Advanced directives: requested   Subjective:  Lauren Fry is a 64 y.o. female who presents for Medicare Annual Wellness Visit and 3 month follow up for labile HTN, HLD, abnormal glucose and Vitamin D deficiency.  She reports that she started to have a headache and fatigue that started 09/28/20 and continued for 5-6days.  She took ibuprofen 800mg  and Excedrin migraine to help with her head. It was generalize with some focal pressure behind her eyes.  She also used heat and ice packs.  It help manage the symptoms.  She took a home COVID test the day the symptoms started.  She did not have any fevers or other sinus symptoms.  She reports no residual sysmptom and feels like she has bounced back.    Marland Kitchen Her blood pressure has been controlled at home, today their BP is BP: 122/76 She does workout. She denies chest pain, shortness of breath, dizziness.  She is not on cholesterol medication and denies myalgias. Her cholesterol is not at goal. The cholesterol last visit was:   Lab Results  Component Value Date   CHOL 194 08/24/2019   HDL 52 08/24/2019   LDLCALC 114 (H) 08/24/2019   TRIG 166 (H) 08/24/2019   CHOLHDL 3.7 08/24/2019    She has been working on diet and exercise for abnormal glucose/prediabetes, and  (A1c 5.7%, 2011 & 5.6%, 2016) denies paresthesia of the feet, polydipsia, polyuria, visual disturbances, vomiting and weight loss.  Last A1C in the office was:  Lab Results  Component Value Date   HGBA1C 5.1 08/24/2019   Last GFR:   Lab Results  Component Value Date   GFRNONAA >60 11/23/2019   Lab Results  Component Value Date   GFRAA >60 11/23/2019   Patient is on Vitamin D supplement for defciency,    Lab Results  Component Value Date   VD25OH 76 08/24/2019      Medication Review: Current Outpatient  Medications on File Prior to Visit  Medication Sig Dispense Refill   aspirin 81 MG chewable tablet Chew 81 mg by mouth daily.     Cholecalciferol (VITAMIN D PO) Take 5,000 Units by mouth daily.      citalopram (CELEXA) 40 MG tablet TAKE 1 TABLET DAILY FOR MOOD (Patient taking differently: Take 40 mg by mouth daily.) 90 tablet 3   ibuprofen (ADVIL) 800 MG tablet Take 1 tablet (800 mg total) by mouth every 8 (eight) hours. 30 tablet 0   Milk Thistle 250 MG CAPS Take 1 capsule (250 mg total) by mouth daily.  0   vitamin C (ASCORBIC ACID) 500 MG tablet Take 500 mg by mouth daily.     vitamin E 400 UNIT capsule Take 400 Units by mouth daily.     zinc gluconate 50 MG tablet Take 50 mg by mouth daily.     No current facility-administered medications on file prior to visit.    No Known Allergies  Current Problems (verified) Patient Active Problem List   Diagnosis Date Noted   S/P laparoscopic assisted vaginal hysterectomy (LAVH) 11/24/2019   CIN III (cervical intraepithelial neoplasia grade III) with severe dysplasia 02/04/2019   Screening for malignant neoplasm of the rectum 10/08/2016   Medication management 03/20/2016   Hyperlipidemia    Vitamin D deficiency     Screening Tests Immunization History  Administered Date(s) Administered   Influenza Inj Mdck Quad With Preservative 11/13/2018   Influenza, High Dose Seasonal PF 08/24/2019, 07/13/2020   Influenza,inj,Quad PF,6+ Mos 11/18/2017   Influenza-Unspecified 07/06/2018   PFIZER SARS-COV-2 Vaccination 12/02/2019, 12/11/2019, 01/03/2020, 01/05/2020, 07/13/2020   PPD Test 03/09/2014, 03/13/2015, 03/21/2016, 04/22/2017, 07/16/2018   Pneumococcal Conjugate-13 08/24/2019   Pneumococcal Polysaccharide-23 10/17/2020   Tdap 06/04/2007, 03/09/2014   Zoster 08/26/2014    Preventative care: Last colonoscopy: 2019 Last mammogram: 01/13/20 Last pap smear/pelvic exam: 08/18/2019, Dr Landry Mellow DEXA: 05/2012, [facility] Will order  today  Prior vaccinations: TD or Tdap: 03/2014  Influenza: 07/2020 Pneumococcal: received today, 10/17/20 Prevnar13: 08/2019 Shingles/Zostavax: 08/2014 Shingrix: Discussed  Names of Other Physician/Practitioners you currently use: 1. [facility] here for primary care 2.     Eye Exam:  11/2019 3.Dental Exam:  Patient Care Team: Unk Pinto, MD as PCP - General (Internal Medicine) Clarene Essex, MD as Consulting Physician (Gastroenterology)  SURGICAL HISTORY She  has a past surgical history that includes LASIK (1990); LASIK (1990); Breast lumpectomy with radioactive seed localization (Right, 02/25/2017); Breast excisional biopsy (Right, 02/25/2017); LEEP (N/A, 02/04/2019); and Laparoscopic vaginal hysterectomy with salpingo oophorectomy (Bilateral, 11/24/2019). FAMILY HISTORY Her family history includes Cancer (age of onset: 31) in her sister; Cancer (age of onset: 48) in her mother; Cancer (age of onset: 57) in her brother; Dementia in her father. SOCIAL HISTORY She  reports that she quit smoking about 37 years ago. She has never used smokeless tobacco. She reports current alcohol use of about 4.0 standard drinks of alcohol per week. She reports that she does not use drugs.  MEDICARE WELLNESS OBJECTIVES: Physical activity: Current Exercise Habits: Home exercise routine, Type of exercise: calisthenics;strength training/weights, Time (Minutes): 40, Frequency (Times/Week): 3, Weekly Exercise (Minutes/Week): 120 Cardiac risk factors: Cardiac Risk Factors include: advanced age (>81men, >70 women) Depression/mood screen:   Depression screen [facility] 2/9 10/17/2020  Decreased Interest 0  Down, Depressed, Hopeless 0  PHQ - 2 Score 0    ADLs:  In your present state of health, do you have any difficulty performing the following activities: 10/17/2020 09/26/2020  Hearing? N N  Vision? N N  Difficulty concentrating or making decisions? N N  Walking or climbing  stairs? N N  Dressing or bathing? N N  Doing errands, shopping? N N  Preparing Food and eating ? N -  Using the Toilet? N -  In the past six months, have you accidently leaked urine? N -  Do you have problems with loss of bowel control? N -  Managing your Medications? N -  Managing your Finances? N -  Housekeeping or managing your Housekeeping? N -  Some recent data might be hidden     Cognitive Testing  Alert? Yes  Normal Appearance?Yes  Oriented to person? Yes  Place? Yes   Time? Yes  Recall of three objects?  Yes  Can perform simple calculations? Yes  Displays appropriate judgment?Yes  Can read the correct time from a watch face?Yes  EOL planning: Does Patient Have a Medical Advance Directive?: Yes,No Type of Advance Directive: [place] will Would patient like information on creating a medical advance directive?: No - Patient declined  Review of Systems  Constitutional: Negative for chills, diaphoresis, fever, malaise/fatigue and weight loss.  HENT: Negative for congestion, ear discharge, ear pain, hearing loss, nosebleeds, sinus pain, sore throat and tinnitus.   Eyes: Negative for blurred vision, double vision, photophobia, pain, discharge and redness.  Respiratory: Negative for cough, hemoptysis, sputum production, shortness of breath, wheezing and stridor.   Cardiovascular: Negative for chest pain, palpitations, orthopnea, claudication, leg swelling and PND.  Gastrointestinal: Negative for abdominal pain, blood in stool, constipation, diarrhea, heartburn, melena, nausea and vomiting.  Genitourinary: Negative for dysuria, flank pain, frequency, hematuria and urgency.  Musculoskeletal: Negative for back pain, falls, joint pain, myalgias and neck pain.  Skin: Negative for itching and rash.  Neurological: Negative for dizziness, tingling, tremors, sensory change, speech change, focal weakness, seizures, loss of consciousness, weakness and headaches.   Endo/Heme/Allergies: Negative for environmental allergies and polydipsia. Does not bruise/bleed easily.  Psychiatric/Behavioral: Negative for depression, hallucinations, memory loss, substance abuse and suicidal ideas. The patient is not nervous/anxious and does not have insomnia.      Objective:     Today's Vitals   10/17/20 1414  BP: 122/76  Pulse: 64  Temp: 98.1 F (36.7 C)  SpO2: 97%  Weight: 147 lb (66.7 kg)  Height: 5' 6.5" (1.689 m)   Body mass index is 23.37 kg/m.  General appearance: alert, no distress, WD/WN, female HEENT: normocephalic, sclerae anicteric, TMs pearly, nares patent, no discharge or erythema, pharynx normal Oral cavity: MMM, no lesions Neck: supple, no lymphadenopathy, no thyromegaly, no masses Heart: RRR, normal S1, S2, no murmurs Lungs: CTA bilaterally, no wheezes, rhonchi, or rales Abdomen: +bs, soft, non tender, non distended, no masses, no hepatomegaly, no splenomegaly Musculoskeletal: nontender, no swelling, no obvious deformity Extremities: no edema, no cyanosis, no clubbing Pulses: 2+ symmetric, upper and lower extremities, normal cap refill Neurological: alert, oriented x 3, CN2-12 intact, strength normal upper extremities and lower  extremities, sensation normal throughout, DTRs 2+ throughout, no cerebellar signs, gait normal Psychiatric: normal affect, behavior normal, pleasant   Medicare Attestation I have personally reviewed: The patient's medical and social history Their use of alcohol, tobacco or illicit drugs Their current medications and supplements The patient's functional ability including ADLs,fall risks, home safety risks, cognitive, and hearing and visual impairment Diet and physical activities Evidence for depression or mood disorders  The patient's weight, height, BMI, and visual acuity have been recorded in the chart.  I have made referrals, counseling, and provided education to the patient based on review of the above and I  have provided the patient with a written personalized care plan for preventive services.      Garnet Sierras, Laqueta Jean, DNP [facility] 10/17/2020  3:10 PM

## 2020-10-17 NOTE — Patient Instructions (Addendum)
Lauren Fry , Thank you for taking time to come for your Medicare Wellness Visit. I appreciate your ongoing commitment to your health goals. Please review the following plan we discussed and let me know if I can assist you in the future.   These are the goals we discussed: Goals   None     This is a list of the screening recommended for you and due dates:  Health Maintenance  Topic Date Due  . Mammogram  12/28/2020  . Colon Cancer Screening  04/30/2023  . Tetanus Vaccine  03/09/2024  . Flu Shot  Completed  . DEXA scan (bone density measurement)  Completed  . COVID-19 Vaccine  Completed  .  Hepatitis C: One time screening is recommended by Center for Disease Control  (CDC) for  adults born from [birth year] through 1967.   Completed  . Pneumonia vaccines  Completed    You received your Pneumovax vaccination today, 10/17/20.     You are due for DEXA, bone density scan.  We will place the order today and you can scheudle this with your routine mammogram.     Ask your pharmacy about shingrix - it is a 2 part shot that we will not be getting in the office.   Suggest getting AFTER covid vaccines, have to wait at least a month This shot can make you feel bad due to such good immune response it can trigger some inflammation so take tylenol or aleve day of or day after and plan on resting.   Can go to [email or web address] for more information  Shingrix Vaccination  Two vaccines are licensed and recommended to prevent shingles in the [place] Zoster vaccine live (ZVL, Zostavax) has been in use since 2006. Recombinant zoster vaccine (RZV, Shingrix), has been in use since 2017 and is recommended by ACIP as the preferred shingles vaccine.  What Everyone Should Know about Shingles Vaccine (Shingrix) One of the Recommended Vaccines by Disease Shingles vaccination is the only way to protect against shingles and postherpetic neuralgia (PHN), the most  common complication from shingles. CDC recommends that healthy adults 50 years and older get two doses of the shingles vaccine called Shingrix (recombinant zoster vaccine), separated by 2 to 6 months, to prevent shingles and the complications from the disease. Your doctor or pharmacist can give you Shingrix as a shot in your upper arm. Shingrix provides strong protection against shingles and PHN. Two doses of Shingrix is more than 90% effective at preventing shingles and PHN. Protection stays above 85% for at least the first four years after you get vaccinated. Shingrix is the preferred vaccine, over Zostavax (zoster vaccine live), a shingles vaccine in use since 2006. Zostavax may still be used to prevent shingles in healthy adults 60 years and older. For example, you could use Zostavax if a person is allergic to Shingrix, prefers Zostavax, or requests immediate vaccination and Shingrix is unavailable. Who Should Get Shingrix? Healthy adults 50 years and older should get two doses of Shingrix, separated by 2 to 6 months. You should get Shingrix even if in the past you . had shingles  . received Zostavax  . are not sure if you had chickenpox There is no maximum age for getting Shingrix. If you had shingles in the past, you can get Shingrix to help prevent future occurrences of the disease. There is no specific length of time that you need to wait after having shingles before you can receive Shingrix, but generally you should make  sure the shingles rash has gone away before getting vaccinated. You can get Shingrix whether or not you remember having had chickenpox in the past. Studies show that more than 99% of Americans 40 years and older have had chickenpox, even if they don't remember having the disease. Chickenpox and shingles are related because they are caused by the same virus (varicella zoster virus). After a person recovers from chickenpox, the virus stays dormant (inactive) in the body. It can  reactivate years later and cause shingles. If you had Zostavax in the recent past, you should wait at least eight weeks before getting Shingrix. Talk to your healthcare provider to determine the best time to get Shingrix. Shingrix is available in [facility] and pharmacies. To find doctor's offices or pharmacies near you that offer the vaccine, visit [employer] Vaccine FinderExternal. If you have questions about Shingrix, talk with your healthcare provider. Vaccine for Those 3 Years and Older  Shingrix reduces the risk of shingles and PHN by more than 90% in people 74 and older. CDC recommends the vaccine for healthy adults 57 and older.  Who Should Not Get Shingrix? You should not get Shingrix if you: . have ever had a severe allergic reaction to any component of the vaccine or after a dose of Shingrix  . tested negative for immunity to varicella zoster virus. If you test negative, you should get chickenpox vaccine.  . currently have shingles  . currently are pregnant or breastfeeding. Women who are pregnant or breastfeeding should wait to get Shingrix.  Marland Kitchen receive specific antiviral drugs (acyclovir, famciclovir, or valacyclovir) 24 hours before vaccination (avoid use of these antiviral drugs for 14 days after vaccination)- zoster vaccine live only If you have a minor acute (starts suddenly) illness, such as a cold, you may get Shingrix. But if you have a moderate or severe acute illness, you should usually wait until you recover before getting the vaccine. This includes anyone with a temperature of 101.69F or higher. The side effects of the Shingrix are temporary, and usually last 2 to 3 days. While you may experience pain for a few days after getting Shingrix, the pain will be less severe than having shingles and the complications from the disease. How Well Does Shingrix Work? Two doses of Shingrix provides strong protection against shingles and postherpetic neuralgia (PHN), the most common  complication of shingles. . In adults 45 to 66 years old who got two doses, Shingrix was 97% effective in preventing shingles; among adults 70 years and older, Shingrix was 91% effective.  . In adults 64 to 66 years old who got two doses, Shingrix was 91% effective in preventing PHN; among adults 70 years and older, Shingrix was 89% effective. Shingrix protection remained high (more than 85%) in people 70 years and older throughout the four years following vaccination. Since your risk of shingles and PHN increases as you get older, it is important to have strong protection against shingles in your older years. Top of Page  What Are the Possible Side Effects of Shingrix? Studies show that Shingrix is safe. The vaccine helps your body create a strong defense against shingles. As a result, you are likely to have temporary side effects from getting the shots. The side effects may affect your ability to do normal daily activities for 2 to 3 days. Most people got a sore arm with mild or moderate pain after getting Shingrix, and some also had redness and swelling where they got the shot. Some people felt  tired, had muscle pain, a headache, shivering, fever, stomach pain, or nausea. About 1 out of 6 people who got Shingrix experienced side effects that prevented them from doing regular activities. Symptoms went away on their own in about 2 to 3 days. Side effects were more common in younger people. You might have a reaction to the first or second dose of Shingrix, or both doses. If you experience side effects, you may choose to take over-the-counter pain medicine such as ibuprofen or acetaminophen. If you experience side effects from Shingrix, you should report them to the Vaccine Adverse Event Reporting System (VAERS). Your doctor might file this report, or you can do it yourself through the VAERS websiteExternal, or by calling [phone]. If you have any questions about side effects from Shingrix, talk with  your doctor. The shingles vaccine does not contain thimerosal (a preservative containing mercury). Top of Page  When Should I See a Doctor Because of the Side Effects I Experience From Shingrix? In clinical trials, Shingrix was not associated with serious adverse events. In fact, serious side effects from vaccines are extremely rare. For example, for every 1 million doses of a vaccine given, only one or two people may have a severe allergic reaction. Signs of an allergic reaction happen within minutes or hours after vaccination and include hives, swelling of the face and throat, difficulty breathing, a fast heartbeat, dizziness, or weakness. If you experience these or any other life-threatening symptoms, see a doctor right away. Shingrix causes a strong response in your immune system, so it may produce short-term side effects more intense than you are used to from other vaccines. These side effects can be uncomfortable, but they are expected and usually go away on their own in 2 or 3 days. Top of Page  How Can I Pay For Shingrix? There are several ways shingles vaccine may be paid for: Medicare . Medicare Part D plans cover the shingles vaccine, but there may be a cost to you depending on your plan. There may be a copay for the vaccine, or you may need to pay in full then get reimbursed for a certain amount.  . Medicare Part B does not cover the shingles vaccine. Medicaid . Medicaid may or may not cover the vaccine. Contact your insurer to find out. Private health insurance . Many private health insurance plans will cover the vaccine, but there may be a cost to you depending on your plan. Contact your insurer to find out. Vaccine assistance programs . Some pharmaceutical companies provide vaccines to eligible adults who cannot afford them. You may want to check with the vaccine manufacturer, [employer], about Shingrix. If you do not currently have health insurance, learn more about affordable  health coverage optionsExternal. To find doctor's offices or pharmacies near you that offer the vaccine, visit [place]  Know what a healthy weight is for you (roughly BMI <25) and aim to maintain this  Aim for 7+ servings of fruits and vegetables daily  70-80+ fluid ounces of water or unsweet tea for healthy kidneys  Limit to max 1 drink of alcohol per day; avoid smoking/tobacco  Limit animal fats in diet for cholesterol and heart health - choose grass fed whenever available  Avoid highly processed foods, and foods high in saturated/trans fats  Aim for low stress - take time to unwind and care for your mental health  Aim for 150 min of moderate intensity exercise weekly for heart health,  and weights twice weekly for bone health  Aim for 7-9 hours of sleep daily

## 2020-10-18 LAB — LIPID PANEL
Cholesterol: 208 mg/dL — ABNORMAL HIGH (ref <200)
HDL: 46 mg/dL — ABNORMAL LOW (ref >=50)
LDL Cholesterol (Calc): 136 mg/dL (calc) — ABNORMAL HIGH
Non-HDL Cholesterol (Calc): 162 mg/dL (calc) — ABNORMAL HIGH (ref <130)
Total CHOL/HDL Ratio: 4.5 (calc) (ref <5.0)
Triglycerides: 135 mg/dL (ref <150)

## 2020-10-18 LAB — URINALYSIS W MICROSCOPIC + REFLEX CULTURE
Bacteria, UA: NONE SEEN /HPF
Bilirubin Urine: NEGATIVE
Glucose, UA: NEGATIVE
Hgb urine dipstick: NEGATIVE
Hyaline Cast: NONE SEEN /LPF
Leukocyte Esterase: NEGATIVE
Nitrites, Initial: NEGATIVE
Protein, ur: NEGATIVE
Specific Gravity, Urine: 1.028 (ref 1.001–1.03)
pH: 5.5 (ref 5.0–8.0)

## 2020-10-18 LAB — CBC WITH DIFFERENTIAL/PLATELET
Absolute Monocytes: 665 cells/uL (ref 200–950)
Basophils Absolute: 49 cells/uL (ref 0–200)
Basophils Relative: 0.8 %
Eosinophils Absolute: 140 cells/uL (ref 15–500)
Eosinophils Relative: 2.3 %
HCT: 38.8 % (ref 35.0–45.0)
Hemoglobin: 13.2 g/dL (ref 11.7–15.5)
Lymphs Abs: 2135 cells/uL (ref 850–3900)
MCH: 31.6 pg (ref 27.0–33.0)
MCHC: 34 g/dL (ref 32.0–36.0)
MCV: 92.8 fL (ref 80.0–100.0)
MPV: 10.8 fL (ref 7.5–12.5)
Monocytes Relative: 10.9 %
Neutro Abs: 3111 cells/uL (ref 1500–7800)
Neutrophils Relative %: 51 %
Platelets: 273 10*3/uL (ref 140–400)
RBC: 4.18 10*6/uL (ref 3.80–5.10)
RDW: 12 % (ref 11.0–15.0)
Total Lymphocyte: 35 %
WBC: 6.1 10*3/uL (ref 3.8–10.8)

## 2020-10-18 LAB — COMPLETE METABOLIC PANEL WITH GFR
AG Ratio: 1.8 (calc) (ref 1.0–2.5)
ALT: 13 U/L (ref 6–29)
AST: 19 U/L (ref 10–35)
Albumin: 4.1 g/dL (ref 3.6–5.1)
Alkaline phosphatase (APISO): 66 U/L (ref 37–153)
BUN: 20 mg/dL (ref 7–25)
CO2: 28 mmol/L (ref 20–32)
Calcium: 9.1 mg/dL (ref 8.6–10.4)
Chloride: 103 mmol/L (ref 98–110)
Creat: 0.74 mg/dL (ref 0.50–0.99)
GFR, Est African American: 98 mL/min/{1.73_m2} (ref >=60)
GFR, Est Non African American: 84 mL/min/{1.73_m2} (ref >=60)
Globulin: 2.3 g/dL (calc) (ref 1.9–3.7)
Glucose, Bld: 78 mg/dL (ref 65–99)
Potassium: 4.8 mmol/L (ref 3.5–5.3)
Sodium: 139 mmol/L (ref 135–146)
Total Bilirubin: 0.4 mg/dL (ref 0.2–1.2)
Total Protein: 6.4 g/dL (ref 6.1–8.1)

## 2020-10-18 LAB — NO CULTURE INDICATED

## 2021-02-02 ENCOUNTER — Other Ambulatory Visit: Payer: Self-pay | Admitting: Obstetrics and Gynecology

## 2021-02-02 ENCOUNTER — Ambulatory Visit
Admission: RE | Admit: 2021-02-02 | Discharge: 2021-02-02 | Disposition: A | Discharge status: Home / Self Care | Payer: BC Managed Care – PPO | Source: Ambulatory Visit | Attending: Obstetrics and Gynecology | Admitting: Obstetrics and Gynecology

## 2021-02-02 ENCOUNTER — Other Ambulatory Visit: Payer: Self-pay

## 2021-02-02 DIAGNOSIS — Z1231 Encounter for screening mammogram for malignant neoplasm of breast: Secondary | ICD-10-CM

## 2021-05-24 ENCOUNTER — Other Ambulatory Visit: Payer: Self-pay | Admitting: Internal Medicine

## 2021-05-24 DIAGNOSIS — F419 Anxiety disorder, unspecified: Secondary | ICD-10-CM

## 2021-05-24 DIAGNOSIS — R300 Dysuria: Secondary | ICD-10-CM

## 2021-05-24 MED ORDER — CITALOPRAM HYDROBROMIDE 40 MG PO TABS: ORAL_TABLET | ORAL | 3 refills | Status: DC

## 2021-07-17 ENCOUNTER — Encounter (HOSPITAL_BASED_OUTPATIENT_CLINIC_OR_DEPARTMENT_OTHER): Payer: Self-pay

## 2021-07-17 ENCOUNTER — Encounter (HOSPITAL_BASED_OUTPATIENT_CLINIC_OR_DEPARTMENT_OTHER): Payer: BC Managed Care – PPO | Admitting: Family Medicine

## 2021-08-03 ENCOUNTER — Telehealth: Payer: Self-pay

## 2021-08-03 NOTE — Telephone Encounter (Signed)
The patient was contacted to inform her that her Annual Physical Form was faxed to [place]. I left a voice mail for the patient to please contact this office as to where she would like the original form mailed.

## 2021-10-12 ENCOUNTER — Encounter: Payer: Medicare Other | Admitting: Internal Medicine

## 2021-10-15 NOTE — Progress Notes (Signed)
Complete Physical Exam  Assessment:   Lauren Fry was seen today for annual exam.  Diagnoses and all orders for this visit:   Labile hypertension Controlled with lifestyle Monitor blood pressure at home; call if consistently over 130/80 Continue DASH diet.   Reminder to go to the ER if any CP, SOB, nausea, dizziness, severe HA, changes vision/speech, left arm numbness and tingling and jaw pain. -     CBC with Differential/Platelet -     COMPLETE METABOLIC PANEL WITH GFR -     Urinalysis w microscopic - Microalbumin/creatinine urine ratio - EKG  Hyperlipidemia, mixed Continue medications: Discussed dietary and exercise modifications Low fat diet -     Lipid panel  Abnormal glucose Discussed dietary and exercise modifications A1c  Vitamin D deficiency Continue supplementation to maintain goal of 70-100 Taking Vitamin D 5,000 IU daily -Vit D  Osteoporosis Dexa ordered Continue calcium and weight bearing exercise  Former smoker Continues to be smoke free  Medication management Magnesium  Anxiety Doing well on current regiment  Continue Celexa 40mg  Discussed stress management techniques   Discussed good sleep hygiene Discussed increasing physical activity and exercise Increase water intake  Aphthous stomatitis Doing well at this time -     famciclovir (FAMVIR) 500 MG tablet; Take 1 tablet daily #90 3 refills  Over 40 minutes of face to face interview, exam, counseling, chart review and critical decision making was performed  Future Appointments  Date Time Provider [place]  10/17/2022  2:00 PM Magda Bernheim, NP [facility] None     Plan:   During the course of the visit the patient was educated and counseled about appropriate screening and preventive services including:   Pneumococcal vaccine  Prevnar 13 Influenza vaccine Td vaccine Screening electrocardiogram Bone densitometry screening Colorectal cancer screening Diabetes  screening Glaucoma screening Nutrition counseling  Advanced directives: requested   Subjective:  Lauren Fry is a 65 y.o. female who presents for Physical and 3 month follow up for labile HTN, HLD, abnormal glucose and Vitamin D deficiency.  She had repair of right torn meniscus and symptoms have greatly improved.   BMI is Body mass index is 23.82 kg/m., she has been working on diet and exercise. Wt Readings from Last 3 Encounters:  10/17/21 147 lb 9.6 oz (67 kg)  10/17/20 147 lb (66.7 kg)  11/24/19 143 lb (64.9 kg)   She continues to follow with GYN for abnormal Pap smear. She is using Premarin vaginal cream and having repeat Pap smear in 11/2021  She is doing well using Famvir 1 tab daily for prevention of aphthous ulcers.  She does have a history of osteoporosis but last DEXA was 2013, needs DEXA ordered.  Her blood pressure has been controlled at home, today their BP is BP: 104/64 She does workout. She denies chest pain, shortness of breath, dizziness.  She is not on cholesterol medication and denies myalgias. Her cholesterol is not at goal. The cholesterol last visit was:   Lab Results  Component Value Date   CHOL 208 (H) 10/17/2020   HDL 46 (L) 10/17/2020   LDLCALC 136 (H) 10/17/2020   TRIG 135 10/17/2020   CHOLHDL 4.5 10/17/2020    She has been working on diet and exercise for abnormal glucose/prediabetes, and  (A1c 5.7%, 2011 & 5.6%, 2016) denies paresthesia of the feet, polydipsia, polyuria, visual disturbances, vomiting and weight loss. Last A1C in the office was:  Lab Results  Component Value Date   HGBA1C 5.1 08/24/2019  Last GFR:   Lab Results  Component Value Date   GFRNONAA 84 10/17/2020   Lab Results  Component Value Date   GFRAA 98 10/17/2020   Patient is on Vitamin D supplement for defciency,    Lab Results  Component Value Date   VD25OH 76 08/24/2019      Medication Review: Current Outpatient Medications on File Prior to Visit   Medication Sig Dispense Refill   aspirin 81 MG chewable tablet Chew 81 mg by mouth daily.     Cholecalciferol (VITAMIN D PO) Take 5,000 Units by mouth daily.      citalopram (CELEXA) 40 MG tablet Take  1 tablet  Daily  for Mood 90 tablet 3   famciclovir (FAMVIR) 500 MG tablet Take 1 tablet 2 x /day 180 tablet 3   ibuprofen (ADVIL) 800 MG tablet Take 1 tablet (800 mg total) by mouth every 8 (eight) hours. 30 tablet 0   Milk Thistle 250 MG CAPS Take 1 capsule (250 mg total) by mouth daily.  0   vitamin C (ASCORBIC ACID) 500 MG tablet Take 500 mg by mouth daily.     vitamin E 400 UNIT capsule Take 400 Units by mouth daily.     zinc gluconate 50 MG tablet Take 50 mg by mouth daily.     No current facility-administered medications on file prior to visit.    No Known Allergies  Current Problems (verified) Patient Active Problem List   Diagnosis Date Noted   S/P laparoscopic assisted vaginal hysterectomy (LAVH) 11/24/2019   CIN III (cervical intraepithelial neoplasia grade III) with severe dysplasia 02/04/2019   Screening for malignant neoplasm of the rectum 10/08/2016   Medication management 03/20/2016   Hyperlipidemia    Vitamin D deficiency     Screening Tests Immunization History  Administered Date(s) Administered   Influenza Inj Mdck Quad With Preservative 11/13/2018   Influenza, High Dose Seasonal PF 08/24/2019, 07/13/2020   Influenza,inj,Quad PF,6+ Mos 11/18/2017   Influenza-Unspecified 07/06/2018, 09/24/2021   PFIZER(Purple Top)SARS-COV-2 Vaccination 12/11/2019, 01/05/2020, 08/14/2020   PPD Test 03/09/2014, 03/13/2015, 03/21/2016, 04/22/2017, 07/16/2018   Pfizer Covid-19 Vaccine Bivalent Booster 35yrs & up 09/24/2021   Pneumococcal Conjugate-13 08/24/2019   Pneumococcal Polysaccharide-23 10/17/2020   Tdap 06/04/2007, 03/09/2014   Zoster Recombinat (Shingrix) 11/17/2020, 03/16/2021   Zoster, Live 08/26/2014    Preventative care: Last colonoscopy: 2019, due 2024 Last  mammogram: 02/02/21 negative repeat 1 year Last pap smear/pelvic exam: 05/14/21 CIN I to have repeat in 6 months DEXA: 05/2012, osteoporosis   Prior vaccinations: TD or Tdap: 03/2014  Influenza: 09/24/21 Pneumococcal: 10/17/20 Prevnar13: 08/2019 Shingles/Zostavax: 08/2014 Shingrix: 11/17/20, 03/16/21  Names of Other Physician/Practitioners you currently use: 1. [facility] here for primary care 2.     Eye Exam:  2022- following for possible macular degeneration Dr. Andria Frames 3.Dental Exam: Dr. Joneen Caraway 2022 Patient Care Team: Unk Pinto, MD as PCP - General (Internal Medicine) Clarene Essex, MD as Consulting Physician (Gastroenterology)  SURGICAL HISTORY She  has a past surgical history that includes LASIK (1990); LASIK (1990); Breast lumpectomy with radioactive seed localization (Right, 02/25/2017); LEEP (N/A, 02/04/2019); Laparoscopic vaginal hysterectomy with salpingo oophorectomy (Bilateral, 11/24/2019); Breast excisional biopsy (Right, 02/25/2017); and Breast biopsy (Left, 01/18/2020). FAMILY HISTORY Her family history includes Cancer (age of onset: 57) in her sister; Cancer (age of onset: 20) in her mother; Cancer (age of onset: 17) in her brother; Dementia in her father. SOCIAL HISTORY She  reports that she quit smoking about 4  years ago. Her smoking use included cigarettes. She has never used smokeless tobacco. She reports current alcohol use of about 4.0 standard drinks per week. She reports that she does not use drugs.    Review of Systems  Constitutional:  Negative for chills, fever and weight loss.  HENT:  Negative for congestion and hearing loss.   Eyes:  Negative for blurred vision and double vision.  Respiratory:  Negative for cough and shortness of breath.   Cardiovascular:  Negative for chest pain, palpitations, orthopnea and leg swelling.  Gastrointestinal:  Negative for abdominal pain, constipation, diarrhea, heartburn, nausea and  vomiting.  Musculoskeletal:  Negative for falls, joint pain and myalgias.  Skin:  Negative for rash.  Neurological:  Negative for dizziness, tingling, tremors, loss of consciousness and headaches.  Psychiatric/Behavioral:  Negative for depression, memory loss and suicidal ideas.     Objective:     Today's Vitals   10/17/21 1405  BP: 104/64  Pulse: 65  Temp: 97.7 F (36.5 C)  SpO2: 97%  Weight: 147 lb 9.6 oz (67 kg)  Height: 5\' 6"  (1.676 m)   Body mass index is 23.82 kg/m.  General appearance: alert, no distress, WD/WN, female HEENT: normocephalic, sclerae anicteric, TMs pearly, nares patent, no discharge or erythema, pharynx normal Oral cavity: MMM, no lesions Neck: supple, no lymphadenopathy, no thyromegaly, no masses Heart: RRR, normal S1, S2, no murmurs Lungs: CTA bilaterally, no wheezes, rhonchi, or rales Abdomen: +bs, soft, non tender, non distended, no masses, no hepatomegaly, no splenomegaly Musculoskeletal: nontender, no swelling, no obvious deformity Extremities: no edema, no cyanosis, no clubbing Pulses: 2+ symmetric, upper and lower extremities, normal cap refill Neurological: alert, oriented x 3, CN2-12 intact, strength normal upper extremities and lower extremities, sensation normal throughout, DTRs 2+ throughout, no cerebellar signs, gait normal Psychiatric: normal affect, behavior normal, pleasant   EKG: Sinus Bradycardia, no ST changes    [facility]  10/17/2021

## 2021-10-17 ENCOUNTER — Ambulatory Visit (INDEPENDENT_AMBULATORY_CARE_PROVIDER_SITE_OTHER): Payer: Medicare Other | Admitting: Nurse Practitioner

## 2021-10-17 ENCOUNTER — Other Ambulatory Visit: Payer: Self-pay

## 2021-10-17 ENCOUNTER — Encounter: Payer: Self-pay | Admitting: Nurse Practitioner

## 2021-10-17 VITALS — BP 104/64 | HR 65 | Temp 97.7°F | Ht 66.0 in | Wt 147.6 lb

## 2021-10-17 DIAGNOSIS — Z87891 Personal history of nicotine dependence: Secondary | ICD-10-CM

## 2021-10-17 DIAGNOSIS — R7309 Other abnormal glucose: Secondary | ICD-10-CM | POA: Diagnosis not present

## 2021-10-17 DIAGNOSIS — Z79899 Other long term (current) drug therapy: Secondary | ICD-10-CM

## 2021-10-17 DIAGNOSIS — R0989 Other specified symptoms and signs involving the circulatory and respiratory systems: Secondary | ICD-10-CM | POA: Diagnosis not present

## 2021-10-17 DIAGNOSIS — E559 Vitamin D deficiency, unspecified: Secondary | ICD-10-CM | POA: Diagnosis not present

## 2021-10-17 DIAGNOSIS — Z0000 Encounter for general adult medical examination without abnormal findings: Secondary | ICD-10-CM

## 2021-10-17 DIAGNOSIS — M810 Age-related osteoporosis without current pathological fracture: Secondary | ICD-10-CM

## 2021-10-17 DIAGNOSIS — E782 Mixed hyperlipidemia: Secondary | ICD-10-CM | POA: Diagnosis not present

## 2021-10-17 DIAGNOSIS — K120 Recurrent oral aphthae: Secondary | ICD-10-CM

## 2021-10-17 DIAGNOSIS — F419 Anxiety disorder, unspecified: Secondary | ICD-10-CM

## 2021-10-17 MED ORDER — FAMCICLOVIR 500 MG PO TABS: ORAL_TABLET | ORAL | 3 refills | Status: DC

## 2021-10-17 NOTE — Patient Instructions (Signed)
I have put the order through to the [facility] for DEXA, call to make an appointment   [place]   Know what a healthy weight is for you (roughly BMI <25) and aim to maintain this   Aim for 7+ servings of fruits and vegetables daily   70-80+ fluid ounces of water or unsweet tea for healthy kidneys   Limit to max 1 drink of alcohol per day; avoid smoking/tobacco   Limit animal fats in diet for cholesterol and heart health - choose grass fed whenever available   Avoid highly processed foods, and foods high in saturated/trans fats   Aim for low stress - take time to unwind and care for your mental health   Aim for 150 min of moderate intensity exercise weekly for heart health, and weights twice weekly for bone health   Aim for 7-9 hours of sleep daily

## 2021-10-18 LAB — URINALYSIS, ROUTINE W REFLEX MICROSCOPIC
Bilirubin Urine: NEGATIVE
Glucose, UA: NEGATIVE
Hgb urine dipstick: NEGATIVE
Ketones, ur: NEGATIVE
Leukocytes,Ua: NEGATIVE
Nitrite: NEGATIVE
Protein, ur: NEGATIVE
Specific Gravity, Urine: 1.012 (ref 1.001–1.035)
pH: 5.5 (ref 5.0–8.0)

## 2021-10-18 LAB — CBC WITH DIFFERENTIAL/PLATELET
Absolute Monocytes: 502 cells/uL (ref 200–950)
Basophils Absolute: 49 cells/uL (ref 0–200)
Basophils Relative: 0.9 %
Eosinophils Absolute: 70 cells/uL (ref 15–500)
Eosinophils Relative: 1.3 %
HCT: 39 % (ref 35.0–45.0)
Hemoglobin: 12.9 g/dL (ref 11.7–15.5)
Lymphs Abs: 1647 cells/uL (ref 850–3900)
MCH: 31.9 pg (ref 27.0–33.0)
MCHC: 33.1 g/dL (ref 32.0–36.0)
MCV: 96.3 fL (ref 80.0–100.0)
MPV: 11.1 fL (ref 7.5–12.5)
Monocytes Relative: 9.3 %
Neutro Abs: 3132 cells/uL (ref 1500–7800)
Neutrophils Relative %: 58 %
Platelets: 214 10*3/uL (ref 140–400)
RBC: 4.05 10*6/uL (ref 3.80–5.10)
RDW: 11.5 % (ref 11.0–15.0)
Total Lymphocyte: 30.5 %
WBC: 5.4 10*3/uL (ref 3.8–10.8)

## 2021-10-18 LAB — COMPLETE METABOLIC PANEL WITH GFR
AG Ratio: 2 (calc) (ref 1.0–2.5)
ALT: 13 U/L (ref 6–29)
AST: 19 U/L (ref 10–35)
Albumin: 4.3 g/dL (ref 3.6–5.1)
Alkaline phosphatase (APISO): 56 U/L (ref 37–153)
BUN: 19 mg/dL (ref 7–25)
CO2: 30 mmol/L (ref 20–32)
Calcium: 9.6 mg/dL (ref 8.6–10.4)
Chloride: 104 mmol/L (ref 98–110)
Creat: 0.96 mg/dL (ref 0.50–1.05)
Globulin: 2.2 g/dL (calc) (ref 1.9–3.7)
Glucose, Bld: 74 mg/dL (ref 65–99)
Potassium: 5.1 mmol/L (ref 3.5–5.3)
Sodium: 140 mmol/L (ref 135–146)
Total Bilirubin: 0.4 mg/dL (ref 0.2–1.2)
Total Protein: 6.5 g/dL (ref 6.1–8.1)
eGFR: 65 mL/min/{1.73_m2} (ref >=60)

## 2021-10-18 LAB — MICROALBUMIN / CREATININE URINE RATIO
Creatinine, Urine: 64 mg/dL (ref 20–275)
Microalb Creat Ratio: 3 mcg/mg creat (ref <30)
Microalb, Ur: 0.2 mg/dL

## 2021-10-18 LAB — MAGNESIUM: Magnesium: 2.3 mg/dL (ref 1.5–2.5)

## 2021-10-18 LAB — LIPID PANEL
Cholesterol: 214 mg/dL — ABNORMAL HIGH (ref <200)
HDL: 51 mg/dL (ref >=50)
LDL Cholesterol (Calc): 126 mg/dL (calc) — ABNORMAL HIGH
Non-HDL Cholesterol (Calc): 163 mg/dL (calc) — ABNORMAL HIGH (ref <130)
Total CHOL/HDL Ratio: 4.2 (calc) (ref <5.0)
Triglycerides: 232 mg/dL — ABNORMAL HIGH (ref <150)

## 2021-10-18 LAB — TSH: TSH: 2.07 mIU/L (ref 0.40–4.50)

## 2021-10-18 LAB — HEMOGLOBIN A1C
Hgb A1c MFr Bld: 5.3 % of total Hgb (ref <5.7)
Mean Plasma Glucose: 105 mg/dL
eAG (mmol/L): 5.8 mmol/L

## 2021-10-18 LAB — VITAMIN D 25 HYDROXY (VIT D DEFICIENCY, FRACTURES): Vit D, 25-Hydroxy: 71 ng/mL (ref 30–100)

## 2021-10-25 ENCOUNTER — Other Ambulatory Visit: Payer: Self-pay | Admitting: Nurse Practitioner

## 2021-10-25 ENCOUNTER — Encounter: Payer: Self-pay | Admitting: Nurse Practitioner

## 2021-10-25 DIAGNOSIS — E782 Mixed hyperlipidemia: Secondary | ICD-10-CM

## 2021-10-25 MED ORDER — ROSUVASTATIN CALCIUM 5 MG PO TABS: 5.0000 mg | ORAL_TABLET | Freq: Every day | ORAL | 11 refills | Status: DC

## 2021-11-12 ENCOUNTER — Other Ambulatory Visit: Payer: Self-pay | Admitting: Obstetrics and Gynecology

## 2021-11-12 ENCOUNTER — Other Ambulatory Visit (HOSPITAL_COMMUNITY)
Admission: RE | Admit: 2021-11-12 | Discharge: 2021-11-12 | Disposition: A | Discharge status: Home / Self Care | Payer: Medicare Other | Source: Ambulatory Visit | Attending: Obstetrics and Gynecology | Admitting: Obstetrics and Gynecology

## 2021-11-12 DIAGNOSIS — Z01419 Encounter for gynecological examination (general) (routine) without abnormal findings: Secondary | ICD-10-CM | POA: Diagnosis present

## 2021-11-12 DIAGNOSIS — R87620 Atypical squamous cells of undetermined significance on cytologic smear of vagina (ASC-US): Secondary | ICD-10-CM | POA: Insufficient documentation

## 2021-11-12 DIAGNOSIS — Z1151 Encounter for screening for human papillomavirus (HPV): Secondary | ICD-10-CM | POA: Diagnosis not present

## 2021-11-14 LAB — CYTOLOGY - PAP
Comment: NEGATIVE
Diagnosis: NEGATIVE
High risk HPV: NEGATIVE

## 2021-11-15 ENCOUNTER — Ambulatory Visit
Admission: RE | Admit: 2021-11-15 | Discharge: 2021-11-15 | Disposition: A | Discharge status: Home / Self Care | Payer: BC Managed Care – PPO | Source: Ambulatory Visit | Attending: Nurse Practitioner | Admitting: Nurse Practitioner

## 2021-11-15 DIAGNOSIS — M810 Age-related osteoporosis without current pathological fracture: Secondary | ICD-10-CM

## 2021-11-20 ENCOUNTER — Other Ambulatory Visit: Payer: BC Managed Care – PPO

## 2021-11-23 ENCOUNTER — Ambulatory Visit
Admission: RE | Admit: 2021-11-23 | Discharge: 2021-11-23 | Disposition: A | Discharge status: Home / Self Care | Payer: Medicare Other | Source: Ambulatory Visit | Attending: Nurse Practitioner | Admitting: Nurse Practitioner

## 2021-11-23 ENCOUNTER — Other Ambulatory Visit: Payer: Self-pay

## 2022-02-26 ENCOUNTER — Ambulatory Visit: Payer: Commercial Managed Care - PPO | Admitting: Nurse Practitioner

## 2022-02-26 ENCOUNTER — Ambulatory Visit
Admission: RE | Admit: 2022-02-26 | Discharge: 2022-02-26 | Disposition: A | Discharge status: Home / Self Care | Payer: Commercial Managed Care - PPO | Source: Ambulatory Visit | Attending: Nurse Practitioner | Admitting: Nurse Practitioner

## 2022-02-26 ENCOUNTER — Encounter: Payer: Self-pay | Admitting: Nurse Practitioner

## 2022-02-26 VITALS — BP 110/62 | HR 71 | Temp 97.7°F | Wt 145.2 lb

## 2022-02-26 DIAGNOSIS — M25619 Stiffness of unspecified shoulder, not elsewhere classified: Secondary | ICD-10-CM

## 2022-02-26 DIAGNOSIS — G8929 Other chronic pain: Secondary | ICD-10-CM

## 2022-02-26 DIAGNOSIS — M25511 Pain in right shoulder: Secondary | ICD-10-CM

## 2022-02-26 NOTE — Progress Notes (Signed)
Assessment and Plan: ? ?Lauren Fry was seen today for an episodic visit. ? ?Diagnoses and all order for this visit: ? ?1. Chronic right shoulder pain/LROM ?RICE method. ?Continue IBU PRN ? ?- DG Shoulder Right; Future ? ? ?Continue to monitor for any increase in pain.  Notify office for further evaluation and treatment, questions or concerns if s/s fail to improve. The risks and benefits of my recommendations, as well as other treatment options were discussed with the patient today. Questions were answered. ? ?Further disposition pending results of labs. Discussed med's effects and SE's.   ? ?Over 15 minutes of exam, counseling, chart review, and critical decision making was performed.  ? ?Future Appointments  ?Date Time Provider [place]  ?10/17/2022  2:00 PM Mull, Townsend Roger, NP [facility] None  ? ? ?------------------------------------------------------------------------------------------------------------------ ? ? ?HPI ?BP 110/62   Pulse 71   Temp 97.7 ?F (36.5 ?C)   Wt 145 lb 3.2 oz (65.9 kg)   SpO2 97%   BMI 23.44 kg/m?  ? ?66 y.o.female presents for evaluation of right shoulder pain.  States that for the past 9 months she has had increasing pain in the right shoulder.  Most notably along the top part of her bicep and around the [facility] joint.  She has trouble sleeping on her right side.  Pain wakes her from sleep.  She takes occasional IBU with some improvement.  She had a recent DEXA scan, however, no recent report on file for review.  Denies any recent fall, injury or trauma.   ? ? ? ?Past Medical History:  ?Diagnosis Date  ? Anxiety   ? Breast mass, right   ? HSV-2 (herpes simplex virus 2) infection   ? Hyperlipidemia   ? Peripheral vascular disease ([place])   ? varicose veins  ? Unspecified vitamin D deficiency   ? Varicose veins   ?  ? ?No Known Allergies ? ?Current Outpatient Medications on File Prior to Visit  ?Medication Sig  ? aspirin 81 MG chewable tablet Chew 81 mg by mouth daily.  ?  Cholecalciferol (VITAMIN D PO) Take 5,000 Units by mouth daily.   ? famciclovir (FAMVIR) 500 MG tablet Take 1 tablet daily  ? ibuprofen (ADVIL) 800 MG tablet Take 1 tablet (800 mg total) by mouth every 8 (eight) hours.  ? Milk Thistle 250 MG CAPS Take 1 capsule (250 mg total) by mouth daily.  ? rosuvastatin (CRESTOR) 5 MG tablet Take 1 tablet (5 mg total) by mouth daily.  ? vitamin C (ASCORBIC ACID) 500 MG tablet Take 500 mg by mouth daily.  ? vitamin E 400 UNIT capsule Take 400 Units by mouth daily.  ? zinc gluconate 50 MG tablet Take 50 mg by mouth daily.  ? citalopram (CELEXA) 40 MG tablet Take  1 tablet  Daily  for Mood (Patient not taking: Reported on 02/26/2022)  ? ?No current facility-administered medications on file prior to visit.  ? ? ?ROS: all negative except what is noted in the HPI.  ? ?Physical Exam: ? ?BP 110/62   Pulse 71   Temp 97.7 ?F (36.5 ?C)   Wt 145 lb 3.2 oz (65.9 kg)   SpO2 97%   BMI 23.44 kg/m?  ? ?General Appearance: NAD.  Awake, conversant and cooperative. ?Eyes: PERRLA, EOMs intact.  Sclera white.  Conjunctiva without erythema. ?Sinuses: No frontal/maxillary tenderness.  No nasal discharge. Nares patent.  ?ENT/Mouth: Ext aud canals clear.  Bilateral TMs w/DOL and without erythema or bulging. Hearing intact.  Posterior pharynx without swelling or exudate.  Tonsils without swelling or erythema.  ?Neck: Supple.  No masses, nodules or thyromegaly. ?Respiratory: Effort is regular with non-labored breathing. Breath sounds are equal bilaterally without rales, rhonchi, wheezing or stridor.  ?Cardio: RRR with no MRGs. Brisk peripheral pulses without edema.  ?Abdomen: Active BS in all four quadrants.  Soft and non-tender without guarding, rebound tenderness, hernias or masses. ?Lymphatics: Non tender without lymphadenopathy.  ?Musculoskeletal: LROM in right shoulder d/t pain.  Unable to abduct without pain. 5/5 strength, normal ambulation.  No clubbing or cyanosis. ?Skin: Appropriate color for  ethnicity. Warm without rashes, lesions, ecchymosis, ulcers.  ?Neuro: CN II-XII grossly normal. Normal muscle tone without cerebellar symptoms and intact sensation.   ?Psych: AO X 3,  appropriate mood and affect, insight and judgment.  ?  ? ?Darrol Jump, NP ?2:16 PM ?[facility] ? ?

## 2022-03-04 ENCOUNTER — Telehealth: Payer: Self-pay | Admitting: Nurse Practitioner

## 2022-03-04 ENCOUNTER — Other Ambulatory Visit: Payer: Self-pay | Admitting: Nurse Practitioner

## 2022-03-04 DIAGNOSIS — G8929 Other chronic pain: Secondary | ICD-10-CM

## 2022-03-04 NOTE — Telephone Encounter (Signed)
Pt is wanting a referral to see ortho Specialist. She wants to go to [place] [phone] and see Dr. Donivan Scull  ?

## 2022-03-18 ENCOUNTER — Ambulatory Visit (INDEPENDENT_AMBULATORY_CARE_PROVIDER_SITE_OTHER): Payer: Commercial Managed Care - PPO | Admitting: Orthopaedic Surgery

## 2022-03-18 DIAGNOSIS — M25511 Pain in right shoulder: Secondary | ICD-10-CM

## 2022-03-18 DIAGNOSIS — M7581 Other shoulder lesions, right shoulder: Secondary | ICD-10-CM

## 2022-03-18 MED ORDER — LIDOCAINE HCL 1 % IJ SOLN
4.0000 mL | INTRAMUSCULAR | Status: AC | PRN
  Administered 2022-03-18: 4 mL

## 2022-03-18 MED ORDER — TRIAMCINOLONE ACETONIDE 40 MG/ML IJ SUSP
80.0000 mg | INTRAMUSCULAR | Status: AC | PRN
  Administered 2022-03-18: 80 mg via INTRA_ARTICULAR

## 2022-03-18 NOTE — Progress Notes (Addendum)
? ?                            ? ? ?Chief Complaint: Right shoulder pain ?  ? ? ?History of Present Illness:  ? ? ?Lauren Fry is a 66 y.o. female presents today with right shoulder pain which has been going on for 9 months patient not have any specific injury.  She is right-hand dominant.  She is having a hard time laying directly on the side.  She has not had any physical therapy or injections.  She is a member here at [employer] [facility] [employer].  She does enjoy overhead sports and enjoys playing pickle ball although this has been quite limited due to her right shoulder pain. ? ? ? ?Surgical History:   ?None ? ?PMH/PSH/Family History/Social History/Meds/Allergies:   ? ?Past Medical History:  ?Diagnosis Date  ? Anxiety   ? Breast mass, right   ? HSV-2 (herpes simplex virus 2) infection   ? Hyperlipidemia   ? Peripheral vascular disease ([place])   ? varicose veins  ? Unspecified vitamin D deficiency   ? Varicose veins   ? ?Past Surgical History:  ?Procedure Laterality Date  ? BREAST BIOPSY Left 01/18/2020  ? benign  ? BREAST EXCISIONAL BIOPSY Right 02/25/2017  ? benign  ? BREAST LUMPECTOMY WITH RADIOACTIVE SEED LOCALIZATION Right 02/25/2017  ? Procedure: RIGHT BREAST LUMPECTOMY WITH RADIOACTIVE SEED LOCALIZATION;  Surgeon: Rolm Bookbinder, MD;  Location: [place];  Service: General;  Laterality: Right;  ? LAPAROSCOPIC VAGINAL HYSTERECTOMY WITH SALPINGO OOPHORECTOMY Bilateral 11/24/2019  ? Procedure: LAPAROSCOPIC ASSISTED VAGINAL HYSTERECTOMY WITH SALPINGO OOPHORECTOMY;  Surgeon: Christophe Louis, MD;  Location: [facility];  Service: Gynecology;  Laterality: Bilateral;  ? LASIK  1990  ? LASIK  1990  ? LEEP N/A 02/04/2019  ? Procedure: LOOP ELECTROSURGICAL EXCISION PROCEDURE (LEEP);  Surgeon: Christophe Louis, MD;  Location: [place];  Service: Gynecology;  Laterality: N/A;  ? ?Social History  ? ?Socioeconomic History  ? Marital status: Married  ?  Spouse name: Not on file  ? Number of  children: Not on file  ? Years of education: Not on file  ? Highest education level: Not on file  ?Occupational History  ? Not on file  ?Tobacco Use  ? Smoking status: Former  ?  Types: Cigarettes  ?  Quit date: 11/04/1982  ?  Years since quitting: 39.3  ? Smokeless tobacco: Never  ?Vaping Use  ? Vaping Use: Never used  ?Substance and Sexual Activity  ? Alcohol use: Yes  ?  Alcohol/week: 4.0 standard drinks  ?  Types: 4 Glasses of wine per week  ?  Comment: social  ? Drug use: No  ? Sexual activity: Yes  ?  Birth control/protection: None  ?Other Topics Concern  ? Not on file  ?Social History Narrative  ? Not on file  ? ?Social Determinants of Health  ? ?Financial Resource Strain: Not on file  ?Food Insecurity: Not on file  ?Transportation Needs: Not on file  ?Physical Activity: Not on file  ?Stress: Not on file  ?Social Connections: Not on file  ? ?Family History  ?Problem Relation Age of Onset  ? Cancer Mother 56  ?     lung  ? Dementia Father   ? Cancer Sister 49  ?     colon  ? Cancer Brother 58  ?     colon  ? ?No  Known Allergies ?Current Outpatient Medications  ?Medication Sig Dispense Refill  ? aspirin 81 MG chewable tablet Chew 81 mg by mouth daily.    ? Cholecalciferol (VITAMIN D PO) Take 5,000 Units by mouth daily.     ? citalopram (CELEXA) 40 MG tablet Take  1 tablet  Daily  for Mood (Patient not taking: Reported on 02/26/2022) 90 tablet 3  ? famciclovir (FAMVIR) 500 MG tablet Take 1 tablet daily 90 tablet 3  ? ibuprofen (ADVIL) 800 MG tablet Take 1 tablet (800 mg total) by mouth every 8 (eight) hours. 30 tablet 0  ? Milk Thistle 250 MG CAPS Take 1 capsule (250 mg total) by mouth daily.  0  ? rosuvastatin (CRESTOR) 5 MG tablet Take 1 tablet (5 mg total) by mouth daily. 30 tablet 11  ? vitamin C (ASCORBIC ACID) 500 MG tablet Take 500 mg by mouth daily.    ? vitamin E 400 UNIT capsule Take 400 Units by mouth daily.    ? zinc gluconate 50 MG tablet Take 50 mg by mouth daily.    ? ?No current  facility-administered medications for this visit.  ? ?No results found. ? ?Review of Systems:   ?A ROS was performed including pertinent positives and negatives as documented in the HPI. ? ?Physical Exam :   ?Constitutional: NAD and appears stated age ?Neurological: Alert and oriented ?Psych: Appropriate affect and cooperative ?There were no vitals taken for this visit.  ? ?Comprehensive Musculoskeletal Exam:   ? ?Musculoskeletal Exam    ?Inspection Right Left  ?Skin No atrophy or winging No atrophy or winging  ?Palpation    ?Tenderness Tenderness about the lateral deltoid None  ?Range of Motion    ?Flexion (passive) 170 170  ?Flexion (active) 170 170  ?Abduction 170 170  ?ER at the side 70 70  ?Can reach behind back to T12 T12  ?Strength    ? Full with pain Full  ?Special Tests    ?Pseudoparalytic No No  ?Neurologic    ?Fires PIN, radial, median, ulnar, musculocutaneous, axillary, suprascapular, long thoracic, and spinal accessory innervated muscles. No abnormal sensibility  ?Vascular/Lymphatic    ?Radial Pulse 2+ 2+  ?Cervical Exam    ?Patient has symmetric cervical range of motion with negative Spurling's test.  ?Special Test: Positive Neer impingement  ? ? ? ?Imaging:   ?Xray (3 views right shoulder): ?Normal ? ? ?I personally reviewed and interpreted the radiographs. ? ? ?Assessment:   ?66 y.o. female right-hand-dominant female presents with right shoulder pain consistent with rotator cuff.  This effect I would recommend that we begin with a subacromial injection as well as physical therapy for rotator cuff program.  She is a member here at [employer] and agrees with this plan.  I will plan to see her back in 2 months for reassessment ? ?Plan :   ? ?-Return to clinic in 8 weeks ? ? ? ? ?Procedure Note ? ?Patient: Lauren Fry             ?Date of Birth: [birth year]           ?MRN: [id number]             ?Visit Date: 03/18/2022 ? ?Procedures: ?Visit Diagnoses:  ?1. Tendinitis of right rotator cuff   ? ? ?Large Joint  Inj: R subacromial bursa on 03/18/2022 4:50 PM ?Indications: pain ?Details: 22 G 1.5 in needle, ultrasound-guided anterior approach ? ?Arthrogram: No ? ?Medications: 4 mL lidocaine 1 %; 80 mg triamcinolone  acetonide 40 MG/ML ?Outcome: tolerated well, no immediate complications ?Procedure, treatment alternatives, risks and benefits explained, specific risks discussed. Consent was given by the patient. Immediately prior to procedure a time out was called to verify the correct patient, procedure, equipment, support staff and site/side marked as required. Patient was prepped and draped in the usual sterile fashion.  ? ? ? ? ? ? ? ? ?I personally saw and evaluated the patient, and participated in the management and treatment plan. ? ?Vanetta Mulders, MD ?Attending Physician, Orthopedic Surgery ? ?This document was dictated using Systems analyst. A reasonable attempt at proof reading has been made to minimize errors. ?

## 2022-04-09 ENCOUNTER — Ambulatory Visit (HOSPITAL_BASED_OUTPATIENT_CLINIC_OR_DEPARTMENT_OTHER): Payer: Commercial Managed Care - PPO | Admitting: Physical Therapy

## 2022-04-09 ENCOUNTER — Encounter (HOSPITAL_BASED_OUTPATIENT_CLINIC_OR_DEPARTMENT_OTHER): Payer: Self-pay

## 2022-04-29 ENCOUNTER — Ambulatory Visit (HOSPITAL_BASED_OUTPATIENT_CLINIC_OR_DEPARTMENT_OTHER): Payer: Commercial Managed Care - PPO | Admitting: Orthopaedic Surgery

## 2022-05-08 ENCOUNTER — Other Ambulatory Visit: Payer: Self-pay | Admitting: Internal Medicine

## 2022-05-08 DIAGNOSIS — F419 Anxiety disorder, unspecified: Secondary | ICD-10-CM

## 2022-06-10 ENCOUNTER — Other Ambulatory Visit: Payer: Self-pay | Admitting: Obstetrics and Gynecology

## 2022-06-10 DIAGNOSIS — Z1231 Encounter for screening mammogram for malignant neoplasm of breast: Secondary | ICD-10-CM

## 2022-06-24 ENCOUNTER — Ambulatory Visit
Admission: RE | Admit: 2022-06-24 | Discharge: 2022-06-24 | Disposition: A | Discharge status: Home / Self Care | Payer: Commercial Managed Care - PPO | Source: Ambulatory Visit | Attending: Obstetrics and Gynecology | Admitting: Obstetrics and Gynecology

## 2022-06-24 DIAGNOSIS — Z1231 Encounter for screening mammogram for malignant neoplasm of breast: Secondary | ICD-10-CM

## 2022-06-26 ENCOUNTER — Other Ambulatory Visit: Payer: Self-pay | Admitting: Obstetrics and Gynecology

## 2022-06-26 DIAGNOSIS — R928 Other abnormal and inconclusive findings on diagnostic imaging of breast: Secondary | ICD-10-CM

## 2022-07-03 ENCOUNTER — Other Ambulatory Visit: Payer: Commercial Managed Care - PPO

## 2022-07-09 ENCOUNTER — Ambulatory Visit
Admission: RE | Admit: 2022-07-09 | Discharge: 2022-07-09 | Disposition: A | Discharge status: Home / Self Care | Payer: Commercial Managed Care - PPO | Source: Ambulatory Visit | Attending: Obstetrics and Gynecology | Admitting: Obstetrics and Gynecology

## 2022-07-09 DIAGNOSIS — R928 Other abnormal and inconclusive findings on diagnostic imaging of breast: Secondary | ICD-10-CM

## 2022-09-20 ENCOUNTER — Other Ambulatory Visit: Payer: Self-pay | Admitting: Nurse Practitioner

## 2022-09-20 DIAGNOSIS — E782 Mixed hyperlipidemia: Secondary | ICD-10-CM

## 2022-10-15 NOTE — Progress Notes (Unsigned)
COMPLETE PHYSICAL EXAM AND FOLLOW UP  Assessment:   Lauren Fry was seen today for annual exam.  Diagnoses and all orders for this visit:  Encounter for general adult medical examination with abnormal findings Due yearly -     EKG 12-Lead  Labile hypertension Controlled with lifestyle Monitor blood pressure at home; call if consistently over 130/80 Continue DASH diet.   Reminder to go to the ER if any CP, SOB, nausea, dizziness, severe HA, changes vision/speech, left arm numbness and tingling and jaw pain. -     CBC with Differential/Platelet -     COMPLETE METABOLIC PANEL WITH GFR -     Urinalysis w microscopic + reflex cultur  Hyperlipidemia, mixed Continue medications: Discussed dietary and exercise modifications Low fat diet -     Lipid panel  Abnormal glucose Discussed dietary and exercise modifications  Vitamin D deficiency Continue supplementation to maintain goal of 70-100 Taking Vitamin D 5,000 IU daily To goal Defer vitamin D level  Former smoker  Medication management Continued  Need for pneumococcal vaccination -     Pneumococcal polysaccharide vaccine 23-valent greater than or equal to 2yo subcutaneous/IM  Aphthous stomatitis Doing well at this time -     famciclovir (FAMVIR) 500 MG tablet; Take 1 tablet 2 x /day  Anxiety Doing well on current regiment  Continue Celexa '40mg'$  Discussed stress management techniques   Discussed good sleep hygiene Discussed increasing physical activity and exercise Increase water intake    Over 40 minutes of face to face interview, exam, counseling, chart review and critical decision making was performed  Future Appointments  Date Time Provider [place]  10/17/2022  2:00 PM Alycia Rossetti, NP [facility] None     Plan:   During the course of the visit the patient was educated and counseled about appropriate screening and preventive services including:   Pneumococcal vaccine  Prevnar 13 Influenza  vaccine Td vaccine Screening electrocardiogram Bone densitometry screening Colorectal cancer screening Diabetes screening Glaucoma screening Nutrition counseling  Advanced directives: requested   Subjective:  Lauren Fry is a 66 y.o. female who presents for Medicare Annual Wellness Visit and 3 month follow up for labile HTN, HLD, abnormal glucose and Vitamin D deficiency.  She reports that she started to have a headache and fatigue that started 09/28/20 and continued for 5-6days.  She took ibuprofen '800mg'$  and Excedrin migraine to help with her head. It was generalize with some focal pressure behind her eyes.  She also used heat and ice packs.  It help manage the symptoms.  She took a home COVID test the day the symptoms started.  She did not have any fevers or other sinus symptoms.  She reports no residual sysmptom and feels like she has bounced back.    Marland Kitchen Her blood pressure has been controlled at home, today their BP is   She does workout. She denies chest pain, shortness of breath, dizziness.  She is not on cholesterol medication and denies myalgias. Her cholesterol is not at goal. The cholesterol last visit was:   Lab Results  Component Value Date   CHOL 214 (H) 10/17/2021   HDL 51 10/17/2021   LDLCALC 126 (H) 10/17/2021   TRIG 232 (H) 10/17/2021   CHOLHDL 4.2 10/17/2021    She has been working on diet and exercise for abnormal glucose/prediabetes, and  (A1c 5.7%, 2011 & 5.6%, 2016) denies paresthesia of the feet, polydipsia, polyuria, visual disturbances, vomiting and weight loss. Last A1C in the office was:  Lab Results  Component Value Date   HGBA1C 5.3 10/17/2021   Last GFR:   Lab Results  Component Value Date   GFRNONAA 84 10/17/2020   Lab Results  Component Value Date   GFRAA 98 10/17/2020   Patient is on Vitamin D supplement for defciency,    Lab Results  Component Value Date   VD25OH 71 10/17/2021      Medication Review: Current Outpatient Medications on  File Prior to Visit  Medication Sig Dispense Refill   aspirin 81 MG chewable tablet Chew 81 mg by mouth daily.     Cholecalciferol (VITAMIN D PO) Take 5,000 Units by mouth daily.      citalopram (CELEXA) 40 MG tablet TAKE 1 TABLET BY MOUTH DAILY FOR MOOD 90 tablet 3   famciclovir (FAMVIR) 500 MG tablet Take 1 tablet daily 90 tablet 3   ibuprofen (ADVIL) 800 MG tablet Take 1 tablet (800 mg total) by mouth every 8 (eight) hours. 30 tablet 0   Milk Thistle 250 MG CAPS Take 1 capsule (250 mg total) by mouth daily.  0   rosuvastatin (CRESTOR) 5 MG tablet TAKE 1 TABLET (5 MG TOTAL) BY MOUTH DAILY. 90 tablet 3   vitamin C (ASCORBIC ACID) 500 MG tablet Take 500 mg by mouth daily.     vitamin E 400 UNIT capsule Take 400 Units by mouth daily.     zinc gluconate 50 MG tablet Take 50 mg by mouth daily.     No current facility-administered medications on file prior to visit.    No Known Allergies  Current Problems (verified) Patient Active Problem List   Diagnosis Date Noted   S/P laparoscopic assisted vaginal hysterectomy (LAVH) 11/24/2019   CIN III (cervical intraepithelial neoplasia grade III) with severe dysplasia 02/04/2019   Screening for malignant neoplasm of the rectum 10/08/2016   Medication management 03/20/2016   Hyperlipidemia    Vitamin D deficiency     Screening Tests Immunization History  Administered Date(s) Administered   Influenza Inj Mdck Quad With Preservative 11/13/2018   Influenza, High Dose Seasonal PF 08/24/2019, 07/13/2020   Influenza,inj,Quad PF,6+ Mos 11/18/2017   Influenza-Unspecified 07/06/2018, 09/24/2021   PFIZER(Purple Top)SARS-COV-2 Vaccination 12/11/2019, 01/05/2020, 08/14/2020   PPD Test 03/09/2014, 03/13/2015, 03/21/2016, 04/22/2017, 07/16/2018   Pfizer Covid-19 Vaccine Bivalent Booster 22yr & up 09/24/2021   Pneumococcal Conjugate-13 08/24/2019   Pneumococcal Polysaccharide-23 10/17/2020   Tdap 06/04/2007, 03/09/2014   Zoster Recombinat (Shingrix)  11/17/2020, 03/16/2021   Zoster, Live 08/26/2014    Preventative care: Last colonoscopy: 2019 Last mammogram: 01/13/20 Last pap smear/pelvic exam: 08/18/2019, Dr CLandry MellowDEXA: 05/2012, DUE Will order today  Prior vaccinations: TD or Tdap: 03/2014  Influenza: 07/2020 Pneumococcal: received today, 10/17/20 Prevnar13: 08/2019 Shingles/Zostavax: 08/2014 Shingrix: Discussed  Names of Other Physician/Practitioners you currently use: 1. [facility] here for primary care 2.     Eye Exam:  11/2019 3.Dental Exam:  Patient Care Team: MUnk Pinto MD as PCP - General (Internal Medicine) MClarene Essex MD as Consulting Physician (Gastroenterology)  SURGICAL HISTORY She  has a past surgical history that includes LASIK (1990); LASIK (1990); Breast lumpectomy with radioactive seed localization (Right, 02/25/2017); LEEP (N/A, 02/04/2019); Laparoscopic vaginal hysterectomy with salpingo oophorectomy (Bilateral, 11/24/2019); Breast excisional biopsy (Right, 02/25/2017); and Breast biopsy (Left, 01/18/2020). FAMILY HISTORY Her family history includes Cancer (age of onset: 416 in her sister; Cancer (age of onset: 519 in her mother; Cancer (age of onset: 569 in her brother; Dementia in her father.  SOCIAL HISTORY She  reports that she quit smoking about 39 years ago. Her smoking use included cigarettes. She has never used smokeless tobacco. She reports current alcohol use of about 4.0 standard drinks of alcohol per week. She reports that she does not use drugs.   MEDICARE WELLNESS OBJECTIVES: Physical activity:   Cardiac risk factors:   Depression/mood screen:      10/17/2020    3:09 PM  Depression screen PHQ 2/9  Decreased Interest 0  Down, Depressed, Hopeless 0  PHQ - 2 Score 0    ADLs:      No data to display            Cognitive Testing  Alert? Yes  Normal Appearance?Yes  Oriented to person? Yes  Place? Yes   Time? Yes  Recall of three objects?   Yes  Can perform simple calculations? Yes  Displays appropriate judgment?Yes  Can read the correct time from a watch face?Yes  EOL planning:    Review of Systems  Constitutional:  Negative for chills, diaphoresis, fever, malaise/fatigue and weight loss.  HENT:  Negative for congestion, ear discharge, ear pain, hearing loss, nosebleeds, sinus pain, sore throat and tinnitus.   Eyes:  Negative for blurred vision, double vision, photophobia, pain, discharge and redness.  Respiratory:  Negative for cough, hemoptysis, sputum production, shortness of breath, wheezing and stridor.   Cardiovascular:  Negative for chest pain, palpitations, orthopnea, claudication, leg swelling and PND.  Gastrointestinal:  Negative for abdominal pain, blood in stool, constipation, diarrhea, heartburn, melena, nausea and vomiting.  Genitourinary:  Negative for dysuria, flank pain, frequency, hematuria and urgency.  Musculoskeletal:  Negative for back pain, falls, joint pain, myalgias and neck pain.  Skin:  Negative for itching and rash.  Neurological:  Negative for dizziness, tingling, tremors, sensory change, speech change, focal weakness, seizures, loss of consciousness, weakness and headaches.  Endo/Heme/Allergies:  Negative for environmental allergies and polydipsia. Does not bruise/bleed easily.  Psychiatric/Behavioral:  Negative for depression, hallucinations, memory loss, substance abuse and suicidal ideas. The patient is not nervous/anxious and does not have insomnia.      Objective:     There were no vitals filed for this visit.  There is no height or weight on file to calculate BMI.  General appearance: alert, no distress, WD/WN, female HEENT: normocephalic, sclerae anicteric, TMs pearly, nares patent, no discharge or erythema, pharynx normal Oral cavity: MMM, no lesions Neck: supple, no lymphadenopathy, no thyromegaly, no masses Heart: RRR, normal S1, S2, no murmurs Lungs: CTA bilaterally, no  wheezes, rhonchi, or rales Abdomen: +bs, soft, non tender, non distended, no masses, no hepatomegaly, no splenomegaly Musculoskeletal: nontender, no swelling, no obvious deformity Extremities: no edema, no cyanosis, no clubbing Pulses: 2+ symmetric, upper and lower extremities, normal cap refill Neurological: alert, oriented x 3, CN2-12 intact, strength normal upper extremities and lower extremities, sensation normal throughout, DTRs 2+ throughout, no cerebellar signs, gait normal Psychiatric: normal affect, behavior normal, pleasant   Medicare Attestation I have personally reviewed: The patient's medical and social history Their use of alcohol, tobacco or illicit drugs Their current medications and supplements The patient's functional ability including ADLs,fall risks, home safety risks, cognitive, and hearing and visual impairment Diet and physical activities Evidence for depression or mood disorders  The patient's weight, height, BMI, and visual acuity have been recorded in the chart.  I have made referrals, counseling, and provided education to the patient based on review of the above and I have provided the patient  with a written personalized care plan for preventive services.      Garnet Sierras, Laqueta Jean, DNP [facility] 10/15/2022  1:08 PM

## 2022-10-17 ENCOUNTER — Ambulatory Visit (INDEPENDENT_AMBULATORY_CARE_PROVIDER_SITE_OTHER): Payer: Commercial Managed Care - PPO | Admitting: Nurse Practitioner

## 2022-10-17 ENCOUNTER — Encounter: Payer: Self-pay | Admitting: Nurse Practitioner

## 2022-10-17 VITALS — BP 110/58 | HR 70 | Temp 97.7°F | Ht 66.0 in | Wt 151.8 lb

## 2022-10-17 DIAGNOSIS — Z136 Encounter for screening for cardiovascular disorders: Secondary | ICD-10-CM

## 2022-10-17 DIAGNOSIS — Z0000 Encounter for general adult medical examination without abnormal findings: Secondary | ICD-10-CM

## 2022-10-17 DIAGNOSIS — Z0001 Encounter for general adult medical examination with abnormal findings: Secondary | ICD-10-CM

## 2022-10-17 DIAGNOSIS — E782 Mixed hyperlipidemia: Secondary | ICD-10-CM

## 2022-10-17 DIAGNOSIS — M19042 Primary osteoarthritis, left hand: Secondary | ICD-10-CM

## 2022-10-17 DIAGNOSIS — Z79899 Other long term (current) drug therapy: Secondary | ICD-10-CM

## 2022-10-17 DIAGNOSIS — E559 Vitamin D deficiency, unspecified: Secondary | ICD-10-CM

## 2022-10-17 DIAGNOSIS — Z87891 Personal history of nicotine dependence: Secondary | ICD-10-CM

## 2022-10-17 DIAGNOSIS — R7309 Other abnormal glucose: Secondary | ICD-10-CM

## 2022-10-17 DIAGNOSIS — Z1389 Encounter for screening for other disorder: Secondary | ICD-10-CM

## 2022-10-17 DIAGNOSIS — K120 Recurrent oral aphthae: Secondary | ICD-10-CM

## 2022-10-17 DIAGNOSIS — R0989 Other specified symptoms and signs involving the circulatory and respiratory systems: Secondary | ICD-10-CM

## 2022-10-17 DIAGNOSIS — F419 Anxiety disorder, unspecified: Secondary | ICD-10-CM

## 2022-10-17 DIAGNOSIS — Z1329 Encounter for screening for other suspected endocrine disorder: Secondary | ICD-10-CM

## 2022-10-17 DIAGNOSIS — I700 Atherosclerosis of aorta: Secondary | ICD-10-CM | POA: Diagnosis not present

## 2022-10-17 NOTE — Patient Instructions (Addendum)
Arthritis Arthritis is a term that is commonly used to refer to joint pain or joint disease. There are more than 100 types of arthritis. What are the causes? The most common cause of this condition is wear and tear of a joint. Other causes include: Gout. Inflammation of a joint. An infection of a joint. Sprains and other injuries near the joint. A reaction to medicines or drugs, or an allergic reaction. In some cases, the cause may not be known. What are the signs or symptoms? The main symptom of this condition is pain in the joint during movement. Other symptoms include: Redness, swelling, or stiffness at a joint. Warmth coming from the joint. Fever. Overall feeling of illness. How is this diagnosed? This condition may be diagnosed with a physical exam and tests, including: Blood tests. Urine tests. Imaging tests, such as X-rays, an MRI, or a CT scan. Sometimes, fluid is removed from a joint for testing. How is this treated? This condition may be treated with: Treatment of the cause, if it is known. Rest. Raising (elevating) the joint. Applying cold or hot packs to the joint. Medicines to improve symptoms and reduce inflammation. Injections of a steroid, such as cortisone, into the joint to help reduce pain and inflammation. Depending on the cause of your arthritis, you may need to make lifestyle changes to reduce stress on your joint. Changes may include: Exercising more. Losing weight. Follow these instructions at home: Medicines Take over-the-counter and prescription medicines only as told by your health care provider. Do not take aspirin to relieve pain if your health care provider thinks that gout may be causing your pain. Activity Rest your joint if told by your health care provider. Rest is important when your disease is active and your joint feels painful, swollen, or stiff. Avoid activities that make the pain worse. Balance activity with rest. Exercise your joint  regularly with range-of-motion exercises as told by your health care provider. Try doing low-impact exercise, such as: Swimming. Water aerobics. Biking. Walking. Managing pain, stiffness, and swelling     If directed, put ice on the affected joint. To do this: Put ice in a plastic bag. Place a towel between your skin and the bag. Leave the ice on for 20 minutes, 2-3 times a day. Remove the ice if your skin turns bright red. This is very important. If you cannot feel pain, heat, or cold, you have a greater risk of damage to the area. If your joint is swollen, raise (elevate) it above the level of your heart if directed by your health care provider. If your joint feels stiff in the morning, try taking a warm shower. If directed, apply heat to the affected area as often as told by your health care provider. Use the heat source that your health care provider recommends, such as a moist heat pack or a heating pad. To apply heat: Place a towel between your skin and the heat source. Leave the heat on for 20-30 minutes. Remove the heat if your skin turns bright red. This is especially important if you are unable to feel pain, heat, or cold. You have a greater risk of getting burned. General instructions Maintain a healthy weight. Follow instructions from your health care provider for weight control. Do not use any products that contain nicotine or tobacco. These products include cigarettes, chewing tobacco, and vaping devices, such as e-cigarettes. If you need help quitting, ask your health care provider. Keep all follow-up visits. This is important. Where   to find more information National Institutes of Health: www.niams.nih.gov Contact a health care provider if: The pain gets worse. You have a fever. Get help right away if: You develop severe joint pain, swelling, or redness. Many joints become painful and swollen. You develop severe back pain. You develop severe weakness in your  leg. Summary Arthritis is a term that is commonly used to refer to joint pain or joint disease. There are more than 100 types of arthritis. The most common cause of this condition is wear and tear of a joint. Other causes include gout, inflammation or infection of the joint, sprains, or allergies. Symptoms of this condition include redness, swelling, or stiffness of the joint. Other symptoms include warmth, fever, or feeling ill. This condition is treated with rest, elevation, medicines, and applying cold or hot packs. Follow your health care provider's instructions about medicines, activity, exercises, and other home care treatments. This information is not intended to replace advice given to you by your health care provider. Make sure you discuss any questions you have with your health care provider. Document Revised: 07/31/2021 Document Reviewed: 07/31/2021 Elsevier Patient Education  2023 [employer]  

## 2022-10-18 LAB — CBC WITH DIFFERENTIAL/PLATELET
Absolute Monocytes: 554 cells/uL (ref 200–950)
Basophils Absolute: 39 cells/uL (ref 0–200)
Basophils Relative: 0.7 %
Eosinophils Absolute: 112 cells/uL (ref 15–500)
Eosinophils Relative: 2 %
HCT: 38.9 % (ref 35.0–45.0)
Hemoglobin: 13.2 g/dL (ref 11.7–15.5)
Lymphs Abs: 1551 cells/uL (ref 850–3900)
MCH: 32 pg (ref 27.0–33.0)
MCHC: 33.9 g/dL (ref 32.0–36.0)
MCV: 94.4 fL (ref 80.0–100.0)
MPV: 10.9 fL (ref 7.5–12.5)
Monocytes Relative: 9.9 %
Neutro Abs: 3343 cells/uL (ref 1500–7800)
Neutrophils Relative %: 59.7 %
Platelets: 215 10*3/uL (ref 140–400)
RBC: 4.12 10*6/uL (ref 3.80–5.10)
RDW: 12.1 % (ref 11.0–15.0)
Total Lymphocyte: 27.7 %
WBC: 5.6 10*3/uL (ref 3.8–10.8)

## 2022-10-18 LAB — COMPLETE METABOLIC PANEL WITH GFR
AG Ratio: 2.3 (calc) (ref 1.0–2.5)
ALT: 17 U/L (ref 6–29)
AST: 22 U/L (ref 10–35)
Albumin: 4.4 g/dL (ref 3.6–5.1)
Alkaline phosphatase (APISO): 64 U/L (ref 37–153)
BUN: 22 mg/dL (ref 7–25)
CO2: 30 mmol/L (ref 20–32)
Calcium: 9.2 mg/dL (ref 8.6–10.4)
Chloride: 106 mmol/L (ref 98–110)
Creat: 0.72 mg/dL (ref 0.50–1.05)
Globulin: 1.9 g/dL (calc) (ref 1.9–3.7)
Glucose, Bld: 91 mg/dL (ref 65–99)
Potassium: 4.4 mmol/L (ref 3.5–5.3)
Sodium: 141 mmol/L (ref 135–146)
Total Bilirubin: 0.3 mg/dL (ref 0.2–1.2)
Total Protein: 6.3 g/dL (ref 6.1–8.1)
eGFR: 91 mL/min/{1.73_m2} (ref >=60)

## 2022-10-18 LAB — LIPID PANEL
Cholesterol: 156 mg/dL (ref <200)
HDL: 61 mg/dL (ref >=50)
LDL Cholesterol (Calc): 73 mg/dL (calc)
Non-HDL Cholesterol (Calc): 95 mg/dL (calc) (ref <130)
Total CHOL/HDL Ratio: 2.6 (calc) (ref <5.0)
Triglycerides: 138 mg/dL (ref <150)

## 2022-10-18 LAB — URINALYSIS W MICROSCOPIC + REFLEX CULTURE
Bilirubin Urine: NEGATIVE
Glucose, UA: NEGATIVE
Hgb urine dipstick: NEGATIVE
Hyaline Cast: NONE SEEN /LPF
Ketones, ur: NEGATIVE
Leukocyte Esterase: NEGATIVE
Nitrites, Initial: NEGATIVE
Protein, ur: NEGATIVE
RBC / HPF: NONE SEEN /HPF (ref 0–2)
Specific Gravity, Urine: 1.029 (ref 1.001–1.035)
pH: 6 (ref 5.0–8.0)

## 2022-10-18 LAB — HEMOGLOBIN A1C
Hgb A1c MFr Bld: 5.4 % of total Hgb (ref <5.7)
Mean Plasma Glucose: 108 mg/dL
eAG (mmol/L): 6 mmol/L

## 2022-10-18 LAB — MICROALBUMIN / CREATININE URINE RATIO
Creatinine, Urine: 140 mg/dL (ref 20–275)
Microalb Creat Ratio: 2 mcg/mg creat (ref <30)
Microalb, Ur: 0.3 mg/dL

## 2022-10-18 LAB — NO CULTURE INDICATED

## 2022-10-18 LAB — MAGNESIUM: Magnesium: 2.2 mg/dL (ref 1.5–2.5)

## 2022-10-18 LAB — TSH: TSH: 1.3 mIU/L (ref 0.40–4.50)

## 2022-10-18 LAB — VITAMIN D 25 HYDROXY (VIT D DEFICIENCY, FRACTURES): Vit D, 25-Hydroxy: 50 ng/mL (ref 30–100)

## 2022-11-08 ENCOUNTER — Other Ambulatory Visit: Payer: Self-pay | Admitting: Nurse Practitioner

## 2022-11-08 DIAGNOSIS — K120 Recurrent oral aphthae: Secondary | ICD-10-CM

## 2023-04-22 ENCOUNTER — Ambulatory Visit: Payer: Medicare Other | Admitting: Nurse Practitioner

## 2023-04-23 NOTE — Progress Notes (Unsigned)
6 MONTH FOLLOW UP  Assessment:    Labile hypertension Controlled with lifestyle Monitor blood pressure at home; call if consistently over 130/80 Continue DASH diet.   Reminder to go to the ER if any CP, SOB, nausea, dizziness, severe HA, changes vision/speech, left arm numbness and tingling and jaw pain. -     CBC with Differential/Platelet -     COMPLETE METABOLIC PANEL WITH GFR  Hyperlipidemia, mixed Continue medications: Rosuvastatin 5 mg QD Discussed dietary and exercise modifications Low fat diet -     Lipid panel  Abnormal glucose Discussed dietary and exercise modifications  Vitamin D deficiency Continue supplementation to maintain goal of 70-100 Taking Vitamin D 5,000 IU daily To goal Defer vitamin D level  Arthritis of both hands Continue exercise Advised about joint supplement such as Meg[employer] superior joint care Continue Curcumin  Medication management - Magnesium  Aphthous stomatitis Doing well at this time on Famvir 500 mg QD  Anxiety Doing well on current regiment  Continue Celexa 40mg  Discussed stress management techniques   Discussed good sleep hygiene Discussed increasing physical activity and exercise Increase water intake    Over 40 minutes of face to face interview, exam, counseling, chart review and critical decision making was performed  Future Appointments  Date Time Provider Department Center  04/24/2023  2:45 PM Raynelle Dick, NP [facility] None  10/21/2023  2:00 PM Raynelle Dick, NP [facility] None      Subjective:  Lauren Fry is a 67 y.o. female who presents for Complete Physical. has Hyperlipidemia; Vitamin D deficiency; Medication management; Screening for malignant neoplasm of the rectum; CIN III (cervical intraepithelial neoplasia grade III) with severe dysplasia; and S/P laparoscopic assisted vaginal hysterectomy (LAVH) on their problem list.   BMI is There is no height or weight on file to calculate BMI., she has  been working on diet and exercise. She is doing some exercise, not regular- does play pickle ball Wt Readings from Last 3 Encounters:  10/17/22 151 lb 12.8 oz (68.9 kg)  02/26/22 145 lb 3.2 oz (65.9 kg)  10/17/21 147 lb 9.6 oz (67 kg)    Her blood pressure has been controlled at home, today their BP is    BP Readings from Last 3 Encounters:  10/17/22 (!) 110/58  02/26/22 110/62  10/17/21 104/64  She does workout. She denies chest pain, shortness of breath, dizziness.    She is on cholesterol medication, Rosuvastatin 5 mg daily,  and denies myalgias. Her cholesterol is not at goal. The cholesterol last visit was:   Lab Results  Component Value Date   CHOL 156 10/17/2022   HDL 61 10/17/2022   LDLCALC 73 10/17/2022   TRIG 138 10/17/2022   CHOLHDL 2.6 10/17/2022    She has been working on diet and exercise for abnormal glucose/prediabetes, and  (A1c 5.7%, 2011 & 5.6%, 2016) denies paresthesia of the feet, polydipsia, polyuria, visual disturbances, vomiting and weight loss. Last A1C in the office was:  Lab Results  Component Value Date   HGBA1C 5.4 10/17/2022   She is trying to drink more water. Last GFR: Lab Results  Component Value Date   EGFR 91 10/17/2022    Patient is on Vitamin D supplement for defciency,    Lab Results  Component Value Date   VD25OH 50 10/17/2022      Medication Review: Current Outpatient Medications on File Prior to Visit  Medication Sig Dispense Refill   aspirin 81 MG chewable tablet Chew 81 mg  by mouth daily.     Cholecalciferol (VITAMIN D PO) Take 5,000 Units by mouth daily.      citalopram (CELEXA) 40 MG tablet TAKE 1 TABLET BY MOUTH DAILY FOR MOOD 90 tablet 3   estradiol (ESTRACE) 0.1 MG/GM vaginal cream 1 GRAM VAGINALLY EVERY NIGHT FOR 2 WEEKS THEN 2 TIMES A WEEK 30 DAYS     famciclovir (FAMVIR) 500 MG tablet TAKE 1 TABLET BY MOUTH EVERY DAY 21 tablet 34   ibuprofen (ADVIL) 800 MG tablet Take 1 tablet (800 mg total) by mouth every 8 (eight)  hours. 30 tablet 0   Milk Thistle 250 MG CAPS Take 1 capsule (250 mg total) by mouth daily.  0   PREMARIN vaginal cream AS DIRECTED VAGINAL EVERY NIGHT FOR 2 WEEKS AND THEN 2 TIMES A WEEK 30 DAYS (Patient not taking: Reported on 10/17/2022)     rosuvastatin (CRESTOR) 5 MG tablet TAKE 1 TABLET (5 MG TOTAL) BY MOUTH DAILY. 90 tablet 3   vitamin C (ASCORBIC ACID) 500 MG tablet Take 500 mg by mouth daily.     zinc gluconate 50 MG tablet Take 50 mg by mouth daily.     No current facility-administered medications on file prior to visit.    No Known Allergies  Current Problems (verified) Patient Active Problem List   Diagnosis Date Noted   S/P laparoscopic assisted vaginal hysterectomy (LAVH) 11/24/2019   CIN III (cervical intraepithelial neoplasia grade III) with severe dysplasia 02/04/2019   Screening for malignant neoplasm of the rectum 10/08/2016   Medication management 03/20/2016   Hyperlipidemia    Vitamin D deficiency     Screening Tests Immunization History  Administered Date(s) Administered   Influenza Inj Mdck Quad With Preservative 11/13/2018   Influenza, High Dose Seasonal PF 08/24/2019, 07/13/2020   Influenza,inj,Quad PF,6+ Mos 11/18/2017   Influenza-Unspecified 07/06/2018, 09/24/2021   PFIZER(Purple Top)SARS-COV-2 Vaccination 12/11/2019, 01/05/2020, 08/14/2020   PPD Test 03/09/2014, 03/13/2015, 03/21/2016, 04/22/2017, 07/16/2018   Pfizer Covid-19 Vaccine Bivalent Booster 71yrs & up 09/24/2021   Pneumococcal Conjugate-13 08/24/2019   Pneumococcal Polysaccharide-23 10/17/2020   Tdap 06/04/2007, 03/09/2014   Zoster Recombinat (Shingrix) 11/17/2020, 03/16/2021   Zoster, Live 08/26/2014   Health Maintenance  Topic Date Due   Medicare Annual Wellness (AWV)  10/17/2021   COVID-19 Vaccine (5 - 2023-24 season) 07/05/2022   Colonoscopy  04/30/2023   INFLUENZA VACCINE  06/05/2023   MAMMOGRAM  06/25/2023   DTaP/Tdap/Td (3 - Td or Tdap) 03/09/2024   Pneumonia Vaccine 56+  Years old  Completed   DEXA SCAN  Completed   Hepatitis C Screening  Completed   Zoster Vaccines- Shingrix  Completed   HPV VACCINES  Aged Out    Names of Other Physician/Practitioners you currently use: 1. [facility] here for primary care 2.     Eye Exam:  11/2019 3.Dental Exam:  Patient Care Team: Lucky Cowboy, MD as PCP - General (Internal Medicine) Vida Rigger, MD as Consulting Physician (Gastroenterology)  SURGICAL HISTORY She  has a past surgical history that includes LASIK (1990); LASIK (1990); Breast lumpectomy with radioactive seed localization (Right, 02/25/2017); LEEP (N/A, 02/04/2019); Laparoscopic vaginal hysterectomy with salpingo oophorectomy (Bilateral, 11/24/2019); Breast excisional biopsy (Right, 02/25/2017); and Breast biopsy (Left, 01/18/2020). FAMILY HISTORY Her family history includes Cancer (age of onset: 43) in her sister; Cancer (age of onset: 30) in her mother; Cancer (age of onset: 12) in her brother; Dementia in her father. SOCIAL HISTORY She  reports that she quit smoking  about 40 years ago. Her smoking use included cigarettes. She has never used smokeless tobacco. She reports current alcohol use of about 4.0 standard drinks of alcohol per week. She reports that she does not use drugs.    Review of Systems  Constitutional:  Negative for chills, fever and weight loss.  HENT:  Negative for congestion and hearing loss.   Eyes:  Negative for blurred vision and double vision.  Respiratory:  Negative for cough and shortness of breath.   Cardiovascular:  Negative for chest pain, palpitations, orthopnea and leg swelling.  Gastrointestinal:  Negative for abdominal pain, constipation, diarrhea, heartburn, nausea and vomiting.  Musculoskeletal:  Negative for falls, joint pain and myalgias.  Skin:  Negative for rash.  Neurological:  Negative for dizziness, tingling, tremors, loss of consciousness and headaches.   Psychiatric/Behavioral:  Negative for depression, memory loss and suicidal ideas.      Objective:     There were no vitals filed for this visit.  There is no height or weight on file to calculate BMI.  General appearance: alert, no distress, WD/WN, female HEENT: normocephalic, sclerae anicteric, TMs pearly, nares patent, no discharge or erythema, pharynx normal Oral cavity: MMM, no lesions Neck: supple, no lymphadenopathy, no thyromegaly, no masses Heart: RRR, normal S1, S2, no murmurs Lungs: CTA bilaterally, no wheezes, rhonchi, or rales Abdomen: +bs, soft, non tender, non distended, no masses, no hepatomegaly, no splenomegaly Musculoskeletal: nontender, no swelling, no obvious deformity Extremities: no edema, no cyanosis, no clubbing. Some PIP noticed of hands bilaterally Pulses: 2+ symmetric, upper and lower extremities, normal cap refill Neurological: alert, oriented x 3, CN2-12 intact, strength normal upper extremities and lower extremities, sensation normal throughout, DTRs 2+ throughout, no cerebellar signs, gait normal Psychiatric: normal affect, behavior normal, pleasant     [facility]  04/23/2023

## 2023-04-24 ENCOUNTER — Ambulatory Visit (INDEPENDENT_AMBULATORY_CARE_PROVIDER_SITE_OTHER): Payer: Medicare Other | Admitting: Nurse Practitioner

## 2023-04-24 ENCOUNTER — Encounter: Payer: Self-pay | Admitting: Nurse Practitioner

## 2023-04-24 VITALS — BP 100/60 | HR 62 | Temp 97.8°F | Ht 66.0 in | Wt 148.8 lb

## 2023-04-24 DIAGNOSIS — K120 Recurrent oral aphthae: Secondary | ICD-10-CM

## 2023-04-24 DIAGNOSIS — R7309 Other abnormal glucose: Secondary | ICD-10-CM

## 2023-04-24 DIAGNOSIS — E559 Vitamin D deficiency, unspecified: Secondary | ICD-10-CM | POA: Diagnosis not present

## 2023-04-24 DIAGNOSIS — F419 Anxiety disorder, unspecified: Secondary | ICD-10-CM

## 2023-04-24 DIAGNOSIS — E782 Mixed hyperlipidemia: Secondary | ICD-10-CM | POA: Diagnosis not present

## 2023-04-24 DIAGNOSIS — M545 Low back pain, unspecified: Secondary | ICD-10-CM

## 2023-04-24 DIAGNOSIS — Z79899 Other long term (current) drug therapy: Secondary | ICD-10-CM

## 2023-04-24 DIAGNOSIS — R0989 Other specified symptoms and signs involving the circulatory and respiratory systems: Secondary | ICD-10-CM

## 2023-04-24 DIAGNOSIS — M25551 Pain in right hip: Secondary | ICD-10-CM

## 2023-04-24 LAB — CBC WITH DIFFERENTIAL/PLATELET
Absolute Monocytes: 469 cells/uL (ref 200–950)
Basophils Absolute: 41 cells/uL (ref 0–200)
Basophils Relative: 0.6 %
Eosinophils Absolute: 69 cells/uL (ref 15–500)
Eosinophils Relative: 1 %
HCT: 39.9 % (ref 35.0–45.0)
Hemoglobin: 13.4 g/dL (ref 11.7–15.5)
Lymphs Abs: 1311 cells/uL (ref 850–3900)
MCH: 31.4 pg (ref 27.0–33.0)
MCHC: 33.6 g/dL (ref 32.0–36.0)
MCV: 93.4 fL (ref 80.0–100.0)
MPV: 10.8 fL (ref 7.5–12.5)
Monocytes Relative: 6.8 %
Neutro Abs: 5009 cells/uL (ref 1500–7800)
Neutrophils Relative %: 72.6 %
Platelets: 211 10*3/uL (ref 140–400)
RBC: 4.27 10*6/uL (ref 3.80–5.10)
RDW: 12.1 % (ref 11.0–15.0)
Total Lymphocyte: 19 %
WBC: 6.9 10*3/uL (ref 3.8–10.8)

## 2023-04-24 LAB — COMPLETE METABOLIC PANEL WITH GFR
AG Ratio: 2.2 (calc) (ref 1.0–2.5)
ALT: 14 U/L (ref 6–29)
AST: 19 U/L (ref 10–35)
Albumin: 4.3 g/dL (ref 3.6–5.1)
Alkaline phosphatase (APISO): 58 U/L (ref 37–153)
BUN: 24 mg/dL (ref 7–25)
CO2: 26 mmol/L (ref 20–32)
Calcium: 9.1 mg/dL (ref 8.6–10.4)
Chloride: 104 mmol/L (ref 98–110)
Creat: 0.96 mg/dL (ref 0.50–1.05)
Globulin: 2 g/dL (calc) (ref 1.9–3.7)
Glucose, Bld: 95 mg/dL (ref 65–99)
Potassium: 4.2 mmol/L (ref 3.5–5.3)
Sodium: 138 mmol/L (ref 135–146)
Total Bilirubin: 0.3 mg/dL (ref 0.2–1.2)
Total Protein: 6.3 g/dL (ref 6.1–8.1)
eGFR: 64 mL/min/{1.73_m2} (ref >=60)

## 2023-04-24 LAB — LIPID PANEL
Cholesterol: 160 mg/dL (ref <200)
HDL: 48 mg/dL — ABNORMAL LOW (ref >=50)
LDL Cholesterol (Calc): 78 mg/dL (calc)
Non-HDL Cholesterol (Calc): 112 mg/dL (calc) (ref <130)
Total CHOL/HDL Ratio: 3.3 (calc) (ref <5.0)
Triglycerides: 247 mg/dL — ABNORMAL HIGH (ref <150)

## 2023-04-24 LAB — MAGNESIUM: Magnesium: 2.1 mg/dL (ref 1.5–2.5)

## 2023-04-24 NOTE — Patient Instructions (Signed)
Hip and low back xray ordered at [facility] [address] [place] M-[place] 8:30-3:45  Hip Pain The hip is the joint between the upper legs and the lower pelvis. The bones, cartilage, tendons, and muscles of your hip joint support your body and allow you to move around. Hip pain can range from a minor ache to severe pain in one or both of your hips. The pain may be felt on the inside of the hip joint near the groin, or on the outside near the buttocks and upper thigh. You may also have swelling or stiffness in your hip area. Follow these instructions at home: Managing pain, stiffness, and swelling     If told, put ice on the painful area. Put ice in a plastic bag. Place a towel between your skin and the bag. Leave the ice on for 20 minutes, 2-3 times a day. If told, apply heat to the affected area as often as told by your health care provider. Use the heat source that your provider recommends, such as a moist heat pack or a heating pad. Place a towel between your skin and the heat source. Leave the heat on for 20-30 minutes. If your skin turns bright red, remove the ice or heat right away to prevent skin damage. The risk of damage is higher if you cannot feel pain, heat, or cold. Activity Do exercises as told by your provider. Avoid activities that cause pain. General instructions  Take over-the-counter and prescription medicines only as told by your provider. Keep a journal of your symptoms. Write down: How often you have hip pain. The location of your pain. What the pain feels like. What makes the pain worse. Sleep with a pillow between your legs on your most comfortable side. Keep all follow-up visits. Your provider will monitor your pain and activity. Contact a health care provider if: You cannot put weight on your leg. Your pain or swelling gets worse after a week. It gets harder to walk. You have a fever. Get help right away if: You fall. You have a sudden  increase in pain and swelling in your hip. Your hip is red or swollen or very tender to touch. This information is not intended to replace advice given to you by your health care provider. Make sure you discuss any questions you have with your health care provider. Document Revised: 06/25/2022 Document Reviewed: 06/25/2022 Elsevier Patient Education  2024 [employer].

## 2023-04-25 ENCOUNTER — Ambulatory Visit
Admission: RE | Admit: 2023-04-25 | Discharge: 2023-04-25 | Disposition: A | Discharge status: Home / Self Care | Payer: Medicare Other | Source: Ambulatory Visit | Attending: Nurse Practitioner | Admitting: Nurse Practitioner

## 2023-04-25 ENCOUNTER — Other Ambulatory Visit: Payer: Self-pay | Admitting: Nurse Practitioner

## 2023-04-25 DIAGNOSIS — M25551 Pain in right hip: Secondary | ICD-10-CM

## 2023-04-25 DIAGNOSIS — M545 Low back pain, unspecified: Secondary | ICD-10-CM

## 2023-04-25 DIAGNOSIS — E782 Mixed hyperlipidemia: Secondary | ICD-10-CM

## 2023-04-25 DIAGNOSIS — Z79899 Other long term (current) drug therapy: Secondary | ICD-10-CM

## 2023-04-25 DIAGNOSIS — E559 Vitamin D deficiency, unspecified: Secondary | ICD-10-CM

## 2023-04-25 DIAGNOSIS — R7309 Other abnormal glucose: Secondary | ICD-10-CM

## 2023-04-25 DIAGNOSIS — F419 Anxiety disorder, unspecified: Secondary | ICD-10-CM

## 2023-04-25 DIAGNOSIS — R0989 Other specified symptoms and signs involving the circulatory and respiratory systems: Secondary | ICD-10-CM

## 2023-05-02 ENCOUNTER — Other Ambulatory Visit: Payer: Self-pay | Admitting: Nurse Practitioner

## 2023-05-02 DIAGNOSIS — K120 Recurrent oral aphthae: Secondary | ICD-10-CM

## 2023-05-29 ENCOUNTER — Other Ambulatory Visit: Payer: Self-pay | Admitting: Nurse Practitioner

## 2023-05-29 DIAGNOSIS — E782 Mixed hyperlipidemia: Secondary | ICD-10-CM

## 2023-07-17 ENCOUNTER — Other Ambulatory Visit: Payer: Self-pay | Admitting: Internal Medicine

## 2023-07-17 DIAGNOSIS — Z1231 Encounter for screening mammogram for malignant neoplasm of breast: Secondary | ICD-10-CM

## 2023-07-22 ENCOUNTER — Ambulatory Visit: Payer: Medicare Other

## 2023-07-23 ENCOUNTER — Ambulatory Visit
Admission: RE | Admit: 2023-07-23 | Discharge: 2023-07-23 | Disposition: A | Discharge status: Home / Self Care | Payer: Medicare Other | Source: Ambulatory Visit | Attending: Internal Medicine | Admitting: Internal Medicine

## 2023-07-23 DIAGNOSIS — Z1231 Encounter for screening mammogram for malignant neoplasm of breast: Secondary | ICD-10-CM

## 2023-08-19 ENCOUNTER — Encounter: Payer: Self-pay | Admitting: Nurse Practitioner

## 2023-08-19 ENCOUNTER — Ambulatory Visit: Payer: Medicare Other | Admitting: Nurse Practitioner

## 2023-08-19 VITALS — BP 102/60 | HR 64 | Temp 97.5°F | Ht 66.0 in | Wt 152.6 lb

## 2023-08-19 DIAGNOSIS — R0989 Other specified symptoms and signs involving the circulatory and respiratory systems: Secondary | ICD-10-CM

## 2023-08-19 DIAGNOSIS — N6324 Unspecified lump in the left breast, lower inner quadrant: Secondary | ICD-10-CM | POA: Diagnosis not present

## 2023-08-19 NOTE — Progress Notes (Signed)
Assessment and Plan:  Lauren Fry was seen today for breast mass.  Diagnoses and all orders for this visit:  Mass of lower inner quadrant of left breast Advised of decreasing caffeine use to help with fibrocystic breasts Diagnostic mammogram and ultrasound were ordered of left breast -     MM 3D DIAGNOSTIC MAMMOGRAM UNILATERAL LEFT BREAST; Future -     [place] LIMITED ULTRASOUND INCLUDING AXILLA LEFT BREAST ; Future  Labile hypertension - controlled currently without medication - Continue DASH diet, exercise and monitor at home. Call if greater than 130/80.        Further disposition pending results of labs. Discussed med's effects and SE's.   Over 30 minutes of exam, counseling, chart review, and critical decision making was performed.   Future Appointments  Date Time Provider Department Center  10/21/2023  2:00 PM Raynelle Dick, NP [facility] None    ------------------------------------------------------------------------------------------------------------------   HPI BP 102/60   Pulse 64   Temp (!) 97.5 F (36.4 C)   Ht 5\' 6"  (1.676 m)   Wt 152 lb 9.6 oz (69.2 kg)   SpO2 97%   BMI 24.63 kg/m   67 y.o.female presents for lump in inner area of left breast which she found 1 week ago.  States the area is painful to the touch.  Had normal mammogram at the breast center 07/23/23. Had previous lumpectomy of right breast 2018 which showed fibrocystic changes  BP well controlled without medication BP Readings from Last 3 Encounters:  08/19/23 102/60  04/24/23 100/60  10/17/22 (!) 110/58  Denies headaches, chest pain, shortness of breath and dizziness  BMI is Body mass index is 24.63 kg/m., she has been working on diet and exercise. Wt Readings from Last 3 Encounters:  08/19/23 152 lb 9.6 oz (69.2 kg)  04/24/23 148 lb 12.8 oz (67.5 kg)  10/17/22 151 lb 12.8 oz (68.9 kg)      Past Medical History:  Diagnosis Date   Anxiety    Breast mass, right    HSV-2 (herpes  simplex virus 2) infection    Hyperlipidemia    Peripheral vascular disease (HCC)    varicose veins   Unspecified vitamin D deficiency    Varicose veins      No Known Allergies  Current Outpatient Medications on File Prior to Visit  Medication Sig   aspirin 81 MG chewable tablet Chew 81 mg by mouth daily.   Cholecalciferol (VITAMIN D PO) Take 5,000 Units by mouth daily.    citalopram (CELEXA) 40 MG tablet TAKE 1 TABLET BY MOUTH DAILY FOR MOOD   famciclovir (FAMVIR) 500 MG tablet TAKE 1 TABLET BY MOUTH EVERY DAY   ibuprofen (ADVIL) 800 MG tablet Take 1 tablet (800 mg total) by mouth every 8 (eight) hours.   Milk Thistle 250 MG CAPS Take 1 capsule (250 mg total) by mouth daily.   rosuvastatin (CRESTOR) 5 MG tablet Take  1 tablet  Daily  for Cholesterol                                                                           /  TAKE                                         BY                                                 MOUTH   TURMERIC PO Take by mouth.   vitamin C (ASCORBIC ACID) 500 MG tablet Take 500 mg by mouth daily.   zinc gluconate 50 MG tablet Take 50 mg by mouth daily.   No current facility-administered medications on file prior to visit.    ROS: all negative except above.   Physical Exam:  BP 102/60   Pulse 64   Temp (!) 97.5 F (36.4 C)   Ht 5\' 6"  (1.676 m)   Wt 152 lb 9.6 oz (69.2 kg)   SpO2 97%   BMI 24.63 kg/m   General Appearance: Well nourished, in no apparent distress. Eyes: PERRLA, EOMs, conjunctiva no swelling or erythema Respiratory: Respiratory effort normal, BS equal bilaterally without rales, rhonchi, wheezing or stridor.  Cardio: RRR with no MRGs. Brisk peripheral pulses without edema.  Abdomen: Soft, + BS.  Non tender, no guarding, rebound, hernias, masses. Breasts: right breast normal without mass, skin or nipple changes or axillary nodes, abnormal mass 3 cm palpable in left breast  from 6-9 o'clock, soft, moveable and tender.  Lymphatics: Non tender without lymphadenopathy.  Musculoskeletal: Full ROM, 5/5 strength, normal gait.  Skin: Warm, dry without rashes, lesions, ecchymosis.  Neuro: Cranial nerves intact. Normal muscle tone, no cerebellar symptoms. Sensation intact.  Psych: Awake and oriented X 3, normal affect, Insight and Judgment appropriate.     Raynelle Dick, NP 11:40 AM [facility]

## 2023-08-19 NOTE — Patient Instructions (Addendum)
I have ordered a diagnostic mammogram and ultrasound of the left breast at [facility]- they will call you with appointment  Fibrocystic Breast Changes  Fibrocystic breast changes are changes that can make your breasts swollen, lumpy, or painful. These changes happen when there is scar-like tissue (fibrous tissue) in the breasts or when tiny sacs of fluid (cysts) form in the breast. This is a common condition. It does not mean that you have cancer. It usually happens because of hormone changes during a monthly period. What are the causes? The exact cause of this condition is not known. However, you are more likely to have it: If you have high levels of female hormones. If your mother had the same condition (inherited). What are the signs or symptoms? Tenderness, swelling, mild discomfort, or pain. Rope-like tissue that can be felt when touching the breast. Lumps in one or both breasts. Changes in breast size. Breasts may get larger before your monthly period and smaller after your period. Discharge from the nipple. Symptoms are usually worse before a monthly period starts. They often get better toward the end of periods. How is this treated? Often, treatment is not needed for this condition. In some cases, you may need treatment, including: Taking medicines. Avoiding caffeine. Reducing the amount of sugar and fat in your diet. You may also have: A procedure to remove fluid from a cyst. Surgery to remove a cyst that is large or tender or does not go away. Medicines that may lower female hormones in the body. Follow these instructions at home: Self-care Check your breasts after every monthly period. If you do not have monthly periods, check your breasts on the first day of every month. Check for: Soreness. New swelling or new lumps. A change in breast size. A change in a lump that was already there. General instructions Take over-the-counter and prescription medicines only as  told by your doctor. Wear a support or sports bra that fits well. Wear this support especially when you are exercising. If told by your doctor, avoid or have less caffeine, fat, and sugar in what you eat and drink. Keep all follow-up visits. Contact a doctor if: You have fluid coming from your nipple, especially if the fluid has blood in it. You have new lumps or bumps in your breast. Your breast gets swollen, red, and painful. You have changes in how your breast looks. Your nipple looks flat or it sinks into your breast. Get help right away if: Your breast turns red, and the redness is spreading. Summary Fibrocystic breast changes are changes that can make your breasts swollen, lumpy, or painful. This condition can happen when you have hormone changes during your monthly period. Check your breasts after every monthly period. If you do not have monthly periods, check your breasts on the first day of every month. This information is not intended to replace advice given to you by your health care provider. Make sure you discuss any questions you have with your health care provider. Document Revised: 12/11/2021 Document Reviewed: 12/11/2021 Elsevier Patient Education  2024 [employer].

## 2023-08-28 LAB — HM COLONOSCOPY

## 2023-09-01 ENCOUNTER — Ambulatory Visit
Admission: RE | Admit: 2023-09-01 | Discharge: 2023-09-01 | Disposition: A | Discharge status: Home / Self Care | Payer: Medicare Other | Source: Ambulatory Visit | Attending: Nurse Practitioner | Admitting: Nurse Practitioner

## 2023-09-01 ENCOUNTER — Other Ambulatory Visit: Payer: Self-pay | Admitting: Nurse Practitioner

## 2023-09-01 DIAGNOSIS — N6324 Unspecified lump in the left breast, lower inner quadrant: Secondary | ICD-10-CM

## 2023-09-01 DIAGNOSIS — N6002 Solitary cyst of left breast: Secondary | ICD-10-CM

## 2023-09-02 ENCOUNTER — Encounter: Payer: Self-pay | Admitting: Internal Medicine

## 2023-09-04 ENCOUNTER — Ambulatory Visit
Admission: RE | Admit: 2023-09-04 | Discharge: 2023-09-04 | Disposition: A | Discharge status: Home / Self Care | Payer: Medicare Other | Source: Ambulatory Visit | Attending: Nurse Practitioner | Admitting: Nurse Practitioner

## 2023-09-04 ENCOUNTER — Other Ambulatory Visit: Payer: Self-pay | Admitting: Nurse Practitioner

## 2023-09-04 DIAGNOSIS — N6321 Unspecified lump in the left breast, upper outer quadrant: Secondary | ICD-10-CM

## 2023-09-04 DIAGNOSIS — N6324 Unspecified lump in the left breast, lower inner quadrant: Secondary | ICD-10-CM

## 2023-09-04 DIAGNOSIS — N6002 Solitary cyst of left breast: Secondary | ICD-10-CM

## 2023-09-04 HISTORY — PX: BREAST BIOPSY: SHX20

## 2023-09-05 LAB — SURGICAL PATHOLOGY

## 2023-09-22 ENCOUNTER — Other Ambulatory Visit: Payer: Self-pay | Admitting: Radiation Oncology

## 2023-10-21 ENCOUNTER — Encounter: Payer: Medicare Other | Admitting: Nurse Practitioner

## 2023-10-24 ENCOUNTER — Other Ambulatory Visit: Payer: Self-pay | Admitting: Nurse Practitioner

## 2023-10-24 DIAGNOSIS — R928 Other abnormal and inconclusive findings on diagnostic imaging of breast: Secondary | ICD-10-CM

## 2023-11-14 NOTE — Progress Notes (Signed)
 COMPLETE PHYSICAL EXAM AND FOLLOW UP  Assessment:   Lauren Fry was seen today for annual exam.  Diagnoses and all orders for this visit:  Encounter for general adult medical examination with abnormal findings Due yearly -     EKG 12-Lead  Labile hypertension Controlled with lifestyle Monitor blood pressure at home; call if consistently over 130/80 Continue DASH diet.   Reminder to go to the ER if any CP, SOB, nausea, dizziness, severe HA, changes vision/speech, left arm numbness and tingling and jaw pain. -     CBC with Differential/Platelet -     COMPLETE METABOLIC PANEL WITH GFR -     Urinalysis w microscopic + reflex cultur  Hyperlipidemia, mixed Continue medications: Rosuvastatin  5 mg QD Discussed dietary and exercise modifications Low fat diet -     Lipid panel  Abnormal glucose Discussed dietary and exercise modifications  Vitamin D  deficiency Continue supplementation to maintain goal of 70-100 Taking Vitamin D  5,000 IU daily To goal Defer vitamin D  level   Aphthous stomatitis Doing well at this time on Famvir  500 mg QD  Anxiety Doing well on current regiment  Continue Celexa  40mg  Discussed stress management techniques   Discussed good sleep hygiene Discussed increasing physical activity and exercise Increase water intake   Medication management -     CBC with Differential/Platelet -     COMPLETE METABOLIC PANEL WITH GFR -     Magnesium -     TSH -     Hemoglobin A1C w/out eAG -     VITAMIN D  25 Hydroxy (Vit-D Deficiency, Fractures) -     EKG 12-Lead -     Urinalysis, Routine w reflex microscopic -     Microalbumin / creatinine urine ratio  Laceration with foreign body, right lower leg, sequela -     Td : Tetanus/diphtheria >7yo Preservative  free Continue to follow with podiatry for surgical removal        Over 40 minutes of face to face interview, exam, counseling, chart review and critical decision making was performed  Future Appointments   Date Time Provider Department Center  12/10/2023  8:40 AM [facility] DIAG [facility] 1 [facility]  12/10/2023  8:50 AM [facility] US  1 [facility]  11/16/2024 10:00 AM Jude Lonell BRAVO, NP [facility] None      Subjective:  Lauren Fry is a 68 y.o. female who presents for Complete Physical. has Hyperlipidemia; Vitamin D  deficiency; Medication management; Screening for malignant neoplasm of the rectum; CIN III (cervical intraepithelial neoplasia grade III) with severe dysplasia; and S/P laparoscopic assisted vaginal hysterectomy (LAVH) on their problem list.   BMI is Body mass index is 24.95 kg/m., she has been working on diet and exercise. She is doing some exercise, not regular Wt Readings from Last 3 Encounters:  11/17/23 154 lb 9.6 oz (70.1 kg)  08/19/23 152 lb 9.6 oz (69.2 kg)  04/24/23 148 lb 12.8 oz (67.5 kg)    She will feel fluid in her right are occasionally feels like she hears a swishing sound. Occurs intermittently.   Left eye cataract removed 11/11/23- still on drop regimen. They put in a lens for correcting far vision.   Her right foot great toe was having excruciating pain- saw podiatrist Friday and xray showed a needle in her toe. Still deciding if she is going to have it removed.   Her blood pressure has been controlled at home without medication, today their BP is BP: 112/62  BP Readings from  Last 3 Encounters:  11/17/23 112/62  08/19/23 102/60  04/24/23 100/60  She does workout. She denies chest pain, shortness of breath, dizziness.    She is on cholesterol medication, Rosuvastatin  5 mg daily,  and denies myalgias. Her cholesterol is not at goal. The cholesterol last visit was:   Lab Results  Component Value Date   CHOL 160 04/24/2023   HDL 48 (L) 04/24/2023   LDLCALC 78 04/24/2023   TRIG 247 (H) 04/24/2023   CHOLHDL 3.3 04/24/2023    She has been working on diet and exercise for abnormal glucose/prediabetes, and  (A1c 5.7%, 2011 & 5.6%, 2016) denies  paresthesia of the feet, polydipsia, polyuria, visual disturbances, vomiting and weight loss. Last A1C in the office was:  Lab Results  Component Value Date   HGBA1C 5.4 10/17/2022   She is trying to drink more water. Last GFR: Lab Results  Component Value Date   EGFR 64 04/24/2023    Patient is on Vitamin D  supplement for defciency,    Lab Results  Component Value Date   VD25OH 50 10/17/2022      Medication Review: Current Outpatient Medications on File Prior to Visit  Medication Sig Dispense Refill   aspirin 81 MG chewable tablet Chew 81 mg by mouth daily.     Cholecalciferol (VITAMIN D  PO) Take 5,000 Units by mouth daily.      citalopram  (CELEXA ) 40 MG tablet TAKE 1 TABLET BY MOUTH DAILY FOR MOOD 90 tablet 3   Difluprednate 0.05 % EMUL INSTILL 1 DROP INTO OPERATIVE EYE THREE TIMES DAILY STARTING 3 DAYS PRIOR TO SURGERY AND CONTINUE FOR 1 WEEK AFTER SURGERY, THEN 2 TIMES A DAY FOR 1 WEEK, THEN 1 TIME A DAY FOR 1 WEEK. THEN STOP     famciclovir  (FAMVIR ) 500 MG tablet TAKE 1 TABLET BY MOUTH EVERY DAY 21 tablet 34   gatifloxacin (ZYMAXID) 0.5 % SOLN STARTING 3 DAYS PRIOR TO SURGERY INSTILL 1 DROP INTO OPERATIVE EYE THREE TIMES DAILY. DAY OF SURGERY INSTILL 1 DROP EYERY 2 HOURS AS DIRECTED, THEN 1 DROP 3 TIMES PER DAY FOR 13 DAYS AFTER SURGERY.     ketorolac  (ACULAR ) 0.5 % ophthalmic solution STARTING 3 DAYS PRIOR TO SURGERY INSTILL 1 DROP INTO OPERATIVE EYE TWICE DAILY FOR 4 WEEKS     MAGNESIUM PO Take by mouth.     Milk Thistle 250 MG CAPS Take 1 capsule (250 mg total) by mouth daily.  0   rosuvastatin  (CRESTOR ) 5 MG tablet Take  1 tablet  Daily  for Cholesterol                                                                           /                                                                   TAKE  BY                                                 MOUTH 90 tablet 3   TURMERIC PO Take by mouth.     vitamin C (ASCORBIC ACID) 500 MG tablet  Take 500 mg by mouth daily.     zinc gluconate 50 MG tablet Take 50 mg by mouth daily.     ibuprofen  (ADVIL ) 800 MG tablet Take 1 tablet (800 mg total) by mouth every 8 (eight) hours. (Patient not taking: Reported on 11/17/2023) 30 tablet 0   No current facility-administered medications on file prior to visit.    No Known Allergies  Current Problems (verified) Patient Active Problem List   Diagnosis Date Noted   S/P laparoscopic assisted vaginal hysterectomy (LAVH) 11/24/2019   CIN III (cervical intraepithelial neoplasia grade III) with severe dysplasia 02/04/2019   Screening for malignant neoplasm of the rectum 10/08/2016   Medication management 03/20/2016   Hyperlipidemia    Vitamin D  deficiency     Screening Tests Immunization History  Administered Date(s) Administered   Influenza Inj Mdck Quad With Preservative 11/13/2018   Influenza, High Dose Seasonal PF 08/24/2019, 07/13/2020   Influenza,inj,Quad PF,6+ Mos 11/18/2017   Influenza-Unspecified 07/06/2018, 09/24/2021   PFIZER(Purple Top)SARS-COV-2 Vaccination 12/11/2019, 01/05/2020, 08/14/2020   PPD Test 03/09/2014, 03/13/2015, 03/21/2016, 04/22/2017, 07/16/2018   Pfizer Covid-19 Vaccine Bivalent Booster 77yrs & up 09/24/2021   Pneumococcal Conjugate-13 08/24/2019   Pneumococcal Polysaccharide-23 10/17/2020   Tdap 06/04/2007, 03/09/2014   Zoster Recombinant(Shingrix) 11/17/2020, 03/16/2021   Zoster, Live 08/26/2014   Health Maintenance  Topic Date Due   Medicare Annual Wellness (AWV)  10/17/2021   INFLUENZA VACCINE  06/05/2023   COVID-19 Vaccine (5 - 2024-25 season) 07/06/2023   DTaP/Tdap/Td (3 - Td or Tdap) 03/09/2024   MAMMOGRAM  07/22/2024   Colonoscopy  08/27/2028   Pneumonia Vaccine 93+ Years old  Completed   DEXA SCAN  Completed   Hepatitis C Screening  Completed   Zoster Vaccines- Shingrix  Completed   HPV VACCINES  Aged Out    Names of Other Physician/Practitioners you currently use: 1. [facility] here for primary care  Patient Care Team: Tonita Fallow, MD as PCP - General (Internal Medicine) Rosalie Kitchens, MD as Consulting Physician (Gastroenterology)  SURGICAL HISTORY She  has a past surgical history that includes LASIK (1990); LASIK (1990); Breast lumpectomy with radioactive seed localization (Right, 02/25/2017); LEEP (N/A, 02/04/2019); Laparoscopic vaginal hysterectomy with salpingo oophorectomy (Bilateral, 11/24/2019); Breast excisional biopsy (Right, 02/25/2017); Breast biopsy (Left, 01/18/2020); Breast biopsy (Left, 09/04/2023); and Breast biopsy (Left, 09/04/2023). FAMILY HISTORY Her family history includes Cancer (age of onset: 73) in her sister; Cancer (age of onset: 36) in her mother; Cancer (age of onset: 71) in her brother; Dementia in her father. SOCIAL HISTORY She  reports that she quit smoking about 41 years ago. Her smoking use included cigarettes. She has never used smokeless tobacco. She reports current alcohol use of about 4.0 standard drinks of alcohol per week. She reports that she does not use drugs.    Review of Systems  Constitutional:  Negative for chills, diaphoresis, fever, malaise/fatigue and weight loss.  HENT:  Negative for congestion, ear discharge, ear pain, hearing loss, nosebleeds, sinus pain, sore throat and tinnitus.   Eyes:  Negative for blurred vision,  double vision, photophobia, pain, discharge and redness.  Respiratory:  Negative for cough, hemoptysis, sputum production, shortness of breath, wheezing and stridor.   Cardiovascular:  Negative for chest pain, palpitations, orthopnea, claudication, leg swelling and PND.  Gastrointestinal:  Positive for constipation (using all natural supplement which is helping). Negative for abdominal pain, blood in stool, diarrhea, heartburn, melena, nausea and vomiting.  Genitourinary:  Negative for dysuria, flank pain, frequency, hematuria and urgency.  Musculoskeletal:  Positive  for joint pain (hands). Negative for back pain, falls, myalgias and neck pain.       Right great toe sharp pain- needle in toe to be removed.   Skin:  Negative for itching and rash.  Neurological:  Negative for dizziness, tingling, tremors, sensory change, speech change, focal weakness, seizures, loss of consciousness, weakness and headaches.  Endo/Heme/Allergies:  Negative for environmental allergies and polydipsia. Does not bruise/bleed easily.  Psychiatric/Behavioral:  Negative for depression, hallucinations, memory loss, substance abuse and suicidal ideas. The patient is not nervous/anxious and does not have insomnia.      Objective:     Today's Vitals   11/17/23 1010  BP: 112/62  Pulse: 61  Temp: (!) 97.5 F (36.4 C)  SpO2: 96%  Weight: 154 lb 9.6 oz (70.1 kg)  Height: 5' 6 (1.676 m)    Body mass index is 24.95 kg/m.  General appearance: alert, no distress, WD/WN, female HEENT: normocephalic, sclerae anicteric, TMs pearly, nares patent, no discharge or erythema, pharynx normal Oral cavity: MMM, no lesions Neck: supple, no lymphadenopathy, no thyromegaly, no masses Heart: RRR, normal S1, S2, no murmurs Lungs: CTA bilaterally, no wheezes, rhonchi, or rales Abdomen: +bs, soft, non tender, non distended, no masses, no hepatomegaly, no splenomegaly Musculoskeletal: nontender, no swelling, no obvious deformity Extremities: no edema, no cyanosis, no clubbing. Some PIP noticed of hands bilaterally Pulses: 2+ symmetric, upper and lower extremities, normal cap refill Neurological: alert, oriented x 3, CN2-12 intact, strength normal upper extremities and lower extremities, sensation normal throughout, DTRs 2+ throughout, no cerebellar signs, gait normal Psychiatric: normal affect, behavior normal, pleasant  EKG: Sinus bradycardia, no ST changes    Lonell CHARLENA Jude [facility]  11/17/2023

## 2023-11-17 ENCOUNTER — Ambulatory Visit (INDEPENDENT_AMBULATORY_CARE_PROVIDER_SITE_OTHER): Payer: Medicare Other | Admitting: Nurse Practitioner

## 2023-11-17 ENCOUNTER — Encounter: Payer: Self-pay | Admitting: Nurse Practitioner

## 2023-11-17 VITALS — BP 112/62 | HR 61 | Temp 97.5°F | Ht 66.0 in | Wt 154.6 lb

## 2023-11-17 DIAGNOSIS — R0989 Other specified symptoms and signs involving the circulatory and respiratory systems: Secondary | ICD-10-CM | POA: Diagnosis not present

## 2023-11-17 DIAGNOSIS — S81821S Laceration with foreign body, right lower leg, sequela: Secondary | ICD-10-CM

## 2023-11-17 DIAGNOSIS — S90454D Superficial foreign body, right lesser toe(s), subsequent encounter: Secondary | ICD-10-CM

## 2023-11-17 DIAGNOSIS — Z23 Encounter for immunization: Secondary | ICD-10-CM

## 2023-11-17 DIAGNOSIS — R7309 Other abnormal glucose: Secondary | ICD-10-CM

## 2023-11-17 DIAGNOSIS — E782 Mixed hyperlipidemia: Secondary | ICD-10-CM

## 2023-11-17 DIAGNOSIS — K120 Recurrent oral aphthae: Secondary | ICD-10-CM

## 2023-11-17 DIAGNOSIS — Z136 Encounter for screening for cardiovascular disorders: Secondary | ICD-10-CM

## 2023-11-17 DIAGNOSIS — F419 Anxiety disorder, unspecified: Secondary | ICD-10-CM

## 2023-11-17 DIAGNOSIS — Z0001 Encounter for general adult medical examination with abnormal findings: Secondary | ICD-10-CM

## 2023-11-17 DIAGNOSIS — Z79899 Other long term (current) drug therapy: Secondary | ICD-10-CM

## 2023-11-17 DIAGNOSIS — E559 Vitamin D deficiency, unspecified: Secondary | ICD-10-CM

## 2023-11-17 MED ORDER — FAMCICLOVIR 500 MG PO TABS
ORAL_TABLET | ORAL | 2 refills | Status: DC
Start: 2023-11-17 — End: 2024-03-05

## 2023-11-17 NOTE — Patient Instructions (Signed)

## 2023-11-18 LAB — URINALYSIS, ROUTINE W REFLEX MICROSCOPIC
Bacteria, UA: NONE SEEN /[HPF]
Bilirubin Urine: NEGATIVE
Glucose, UA: NEGATIVE
Hgb urine dipstick: NEGATIVE
Hyaline Cast: NONE SEEN /[LPF]
Ketones, ur: NEGATIVE
Nitrite: NEGATIVE
Protein, ur: NEGATIVE
RBC / HPF: NONE SEEN /[HPF] (ref 0–2)
Specific Gravity, Urine: 1.009 (ref 1.001–1.035)
Squamous Epithelial / HPF: NONE SEEN /[HPF] (ref <=5)
WBC, UA: NONE SEEN /[HPF] (ref 0–5)
pH: 6 (ref 5.0–8.0)

## 2023-11-18 LAB — CBC WITH DIFFERENTIAL/PLATELET
Absolute Lymphocytes: 1570 {cells}/uL (ref 850–3900)
Absolute Monocytes: 518 {cells}/uL (ref 200–950)
Basophils Absolute: 50 {cells}/uL (ref 0–200)
Basophils Relative: 0.7 %
Eosinophils Absolute: 101 {cells}/uL (ref 15–500)
Eosinophils Relative: 1.4 %
HCT: 42 % (ref 35.0–45.0)
Hemoglobin: 13.8 g/dL (ref 11.7–15.5)
MCH: 31 pg (ref 27.0–33.0)
MCHC: 32.9 g/dL (ref 32.0–36.0)
MCV: 94.4 fL (ref 80.0–100.0)
MPV: 11.1 fL (ref 7.5–12.5)
Monocytes Relative: 7.2 %
Neutro Abs: 4961 {cells}/uL (ref 1500–7800)
Neutrophils Relative %: 68.9 %
Platelets: 219 10*3/uL (ref 140–400)
RBC: 4.45 10*6/uL (ref 3.80–5.10)
RDW: 11.8 % (ref 11.0–15.0)
Total Lymphocyte: 21.8 %
WBC: 7.2 10*3/uL (ref 3.8–10.8)

## 2023-11-18 LAB — VITAMIN D 25 HYDROXY (VIT D DEFICIENCY, FRACTURES): Vit D, 25-Hydroxy: 47 ng/mL (ref 30–100)

## 2023-11-18 LAB — COMPLETE METABOLIC PANEL WITH GFR
AG Ratio: 2 (calc) (ref 1.0–2.5)
ALT: 16 U/L (ref 6–29)
AST: 19 U/L (ref 10–35)
Albumin: 4.6 g/dL (ref 3.6–5.1)
Alkaline phosphatase (APISO): 63 U/L (ref 37–153)
BUN: 20 mg/dL (ref 7–25)
CO2: 29 mmol/L (ref 20–32)
Calcium: 10 mg/dL (ref 8.6–10.4)
Chloride: 104 mmol/L (ref 98–110)
Creat: 0.76 mg/dL (ref 0.50–1.05)
Globulin: 2.3 g/dL (ref 1.9–3.7)
Glucose, Bld: 91 mg/dL (ref 65–99)
Potassium: 5.3 mmol/L (ref 3.5–5.3)
Sodium: 140 mmol/L (ref 135–146)
Total Bilirubin: 0.4 mg/dL (ref 0.2–1.2)
Total Protein: 6.9 g/dL (ref 6.1–8.1)
eGFR: 85 mL/min/{1.73_m2} (ref >=60)

## 2023-11-18 LAB — MICROALBUMIN / CREATININE URINE RATIO
Creatinine, Urine: 38 mg/dL (ref 20–275)
Microalb, Ur: <0.2 mg/dL

## 2023-11-18 LAB — MAGNESIUM: Magnesium: 2.2 mg/dL (ref 1.5–2.5)

## 2023-11-18 LAB — HEMOGLOBIN A1C W/OUT EAG: Hgb A1c MFr Bld: 5.5 %{Hb} (ref <5.7)

## 2023-11-18 LAB — TSH: TSH: 2.25 m[IU]/L (ref 0.40–4.50)

## 2023-11-18 LAB — MICROSCOPIC MESSAGE

## 2023-12-08 ENCOUNTER — Telehealth: Payer: Self-pay | Admitting: Nurse Practitioner

## 2023-12-08 DIAGNOSIS — F419 Anxiety disorder, unspecified: Secondary | ICD-10-CM

## 2023-12-08 MED ORDER — CITALOPRAM HYDROBROMIDE 40 MG PO TABS
ORAL_TABLET | ORAL | 3 refills | Status: DC
Start: 2023-12-08 — End: 2024-03-05

## 2023-12-08 NOTE — Telephone Encounter (Signed)
Patient is requesting a refill on Citalopram to [facility] on [address] In [place]

## 2023-12-08 NOTE — Addendum Note (Signed)
Addended by: Dionicio Stall on: 12/08/2023 04:59 PM   Modules accepted: Orders

## 2023-12-10 ENCOUNTER — Ambulatory Visit
Admission: RE | Admit: 2023-12-10 | Discharge: 2023-12-10 | Disposition: A | Discharge status: Home / Self Care | Payer: Medicare Other | Source: Ambulatory Visit | Attending: Nurse Practitioner

## 2023-12-10 ENCOUNTER — Ambulatory Visit
Admission: RE | Admit: 2023-12-10 | Discharge: 2023-12-10 | Disposition: A | Discharge status: Home / Self Care | Payer: Medicare Other | Source: Ambulatory Visit | Attending: Nurse Practitioner | Admitting: Nurse Practitioner

## 2023-12-10 DIAGNOSIS — R928 Other abnormal and inconclusive findings on diagnostic imaging of breast: Secondary | ICD-10-CM

## 2024-03-05 ENCOUNTER — Ambulatory Visit (INDEPENDENT_AMBULATORY_CARE_PROVIDER_SITE_OTHER): Payer: Medicare Other | Admitting: Family Medicine

## 2024-03-05 ENCOUNTER — Encounter: Payer: Self-pay | Admitting: Family Medicine

## 2024-03-05 VITALS — BP 118/64 | HR 65 | Temp 98.1°F | Ht 65.5 in | Wt 152.1 lb

## 2024-03-05 DIAGNOSIS — E782 Mixed hyperlipidemia: Secondary | ICD-10-CM | POA: Diagnosis not present

## 2024-03-05 DIAGNOSIS — N3946 Mixed incontinence: Secondary | ICD-10-CM | POA: Diagnosis not present

## 2024-03-05 DIAGNOSIS — K120 Recurrent oral aphthae: Secondary | ICD-10-CM | POA: Diagnosis not present

## 2024-03-05 DIAGNOSIS — Z780 Asymptomatic menopausal state: Secondary | ICD-10-CM | POA: Diagnosis not present

## 2024-03-05 MED ORDER — FAMCICLOVIR 500 MG PO TABS
ORAL_TABLET | ORAL | 1 refills | Status: DC
Start: 2024-03-05 — End: 2024-07-09

## 2024-03-05 NOTE — Progress Notes (Unsigned)
 New Patient Office Visit  Subjective   Patient ID: Lauren Fry, female    DOB: 09-27-1956  Age: 68 y.o. MRN: [id number]  CC:  Chief Complaint  Patient presents with   Establish Care    HPI Lauren Fry presents to establish care Pt was a former patient of Dr. Cassondra Cliff who recently passed away.  Patient has dx of HLD and depression, however she reports she took herself off of her crestor  and citalopram . States that she did not have any side effects from the medications, but states that her symptoms are stable off the medications.   HLD -- I reviewed her previous labs results, she reports no cardiac symptoms at this time.  Pt is reporting some urine leakage with laughing, coughing, sometimes has difficulty holding her urine. No burning or pain with urination. Has been going on for a several years. I counseled the patient on medications and her options.   Health maintenance reviewed and all PMH, social history and family history.   Current Outpatient Medications  Medication Instructions   ascorbic acid (VITAMIN C) 500 mg, Daily   aspirin 81 mg, Daily   Cholecalciferol (VITAMIN D  PO) 5,000 Units, Daily   famciclovir  (FAMVIR ) 500 MG tablet TAKE 1 TABLET BY MOUTH EVERY DAY   MAGNESIUM PO Take by mouth.   Milk Thistle 250 mg, Oral, Daily   Omega-3 Fatty Acids (FISH OIL PO) Daily   TURMERIC PO Take by mouth.   zinc gluconate 50 mg, Daily    Past Medical History:  Diagnosis Date   Anxiety    Breast mass, right    HSV-2 (herpes simplex virus 2) infection    Hyperlipidemia    Peripheral vascular disease (HCC)    varicose veins   Unspecified vitamin D  deficiency    Varicose veins     Past Surgical History:  Procedure Laterality Date   BREAST BIOPSY Left 01/18/2020   benign   BREAST BIOPSY Left 09/04/2023   BREAST BIOPSY Left 09/04/2023   US  LT BREAST BX W LOC DEV 1ST LESION IMG BX SPEC US  GUIDE 09/04/2023 [facility]MOGRAPHY   BREAST EXCISIONAL BIOPSY Right 02/25/2017    benign   BREAST LUMPECTOMY WITH RADIOACTIVE SEED LOCALIZATION Right 02/25/2017   Procedure: RIGHT BREAST LUMPECTOMY WITH RADIOACTIVE SEED LOCALIZATION;  Surgeon: Enid Harry, MD;  Location: [facility];  Service: General;  Laterality: Right;   LAPAROSCOPIC VAGINAL HYSTERECTOMY WITH SALPINGO OOPHORECTOMY Bilateral 11/24/2019   Procedure: LAPAROSCOPIC ASSISTED VAGINAL HYSTERECTOMY WITH SALPINGO OOPHORECTOMY;  Surgeon: Arlee Lace, MD;  Location: [facility];  Service: Gynecology;  Laterality: Bilateral;   LASIK  1990   LASIK  1990   LEEP N/A 02/04/2019   Procedure: LOOP ELECTROSURGICAL EXCISION PROCEDURE (LEEP);  Surgeon: Arlee Lace, MD;  Location: [facility];  Service: Gynecology;  Laterality: N/A;    Family History  Problem Relation Age of Onset   Cancer Mother 93       lung   Dementia Father    Cancer Sister 9       colon   Cancer Brother 15       colon    Social History   Socioeconomic History   Marital status: Married    Spouse name: Not on file   Number of children: Not on file   Years of education: Not on file   Highest education level: Not on file  Occupational History   Not on file  Tobacco Use   Smoking status: Former  Current packs/day: 0.00    Types: Cigarettes    Quit date: 11/04/1982    Years since quitting: 41.3   Smokeless tobacco: Never  Vaping Use   Vaping status: Never Used  Substance and Sexual Activity   Alcohol use: Yes    Alcohol/week: 4.0 standard drinks of alcohol    Types: 4 Glasses of wine per week    Comment: social   Drug use: No   Sexual activity: Yes    Birth control/protection: None  Other Topics Concern   Not on file  Social History Narrative   Not on file   Social Drivers of Health   Financial Resource Strain: Not on file  Food Insecurity: Not on file  Transportation Needs: Not on file  Physical Activity: Not on file  Stress: Not on file (12/17/2019)  Social Connections:  Unknown (03/19/2022)   Received from [facility]   Social Network    Social Network: Not on file  Intimate Partner Violence: Unknown (02/08/2022)   Received from [facility]   HITS    Physically Hurt: Not on file    Insult or Talk Down To: Not on file    Threaten Physical Harm: Not on file    Scream or Curse: Not on file    Review of Systems  All other systems reviewed and are negative.       Objective   BP 118/64   Pulse 65   Temp 98.1 F (36.7 C) (Oral)   Ht 5' 5.5" (1.664 m)   Wt 152 lb 1.6 oz (69 kg)   SpO2 99%   BMI 24.93 kg/m   Physical Exam Vitals reviewed.  Constitutional:      Appearance: Normal appearance. She is well-groomed and normal weight.  Eyes:     Conjunctiva/sclera: Conjunctivae normal.  Neck:     Thyroid : No thyromegaly.  Cardiovascular:     Rate and Rhythm: Normal rate and regular rhythm.     Pulses: Normal pulses.     Heart sounds: S1 normal and S2 normal.  Pulmonary:     Effort: Pulmonary effort is normal.     Breath sounds: Normal breath sounds and air entry.  Abdominal:     General: Bowel sounds are normal.  Musculoskeletal:     Right lower leg: No edema.     Left lower leg: No edema.  Neurological:     Mental Status: She is alert and oriented to person, place, and time. Mental status is at baseline.     Gait: Gait is intact.  Psychiatric:        Mood and Affect: Mood and affect normal.        Speech: Speech normal.        Behavior: Behavior normal.        Judgment: Judgment normal.         Assessment & Plan:  Mixed hyperlipidemia Assessment & Plan: Pt stopped her statin medication, will check new lipid panel for follow up to decide if it needs to be restarted.   Orders: -     Lipid panel; Future  Aphthous stomatitis Assessment & Plan: On PRN famciclovir , will refill for patient. She denies any recent outbreaks.   Orders: -     Famciclovir ; TAKE 1 TABLET BY MOUTH EVERY DAY  Dispense:  90 tablet; Refill: 1  Mixed stress and urge urinary incontinence Assessment & Plan: We discussed options for treatment, I recommended starting with pelvic floor therapy to help  strengthen the muscles to see if this improves her symptoms. Will send referral.   Orders: -     Ambulatory referral to Physical Therapy  Postmenopausal state -     DG Bone Density; Future    Return in about 8 months (around 11/17/2024).   Aida House, MD

## 2024-03-08 ENCOUNTER — Other Ambulatory Visit (INDEPENDENT_AMBULATORY_CARE_PROVIDER_SITE_OTHER)

## 2024-03-08 ENCOUNTER — Encounter: Payer: Self-pay | Admitting: Family Medicine

## 2024-03-08 ENCOUNTER — Telehealth: Payer: Self-pay

## 2024-03-08 DIAGNOSIS — K120 Recurrent oral aphthae: Secondary | ICD-10-CM | POA: Insufficient documentation

## 2024-03-08 DIAGNOSIS — E782 Mixed hyperlipidemia: Secondary | ICD-10-CM

## 2024-03-08 DIAGNOSIS — N3946 Mixed incontinence: Secondary | ICD-10-CM | POA: Insufficient documentation

## 2024-03-08 LAB — LIPID PANEL
Cholesterol: 198 mg/dL (ref 0–200)
HDL: 49.8 mg/dL (ref >39.00)
LDL Cholesterol: 119 mg/dL — ABNORMAL HIGH (ref 0–99)
NonHDL: 148.41
Total CHOL/HDL Ratio: 4
Triglycerides: 147 mg/dL (ref 0.0–149.0)
VLDL: 29.4 mg/dL (ref 0.0–40.0)

## 2024-03-08 NOTE — Assessment & Plan Note (Signed)
 We discussed options for treatment, I recommended starting with pelvic floor therapy to help strengthen the muscles to see if this improves her symptoms. Will send referral.

## 2024-03-08 NOTE — Telephone Encounter (Signed)
 Pt ready for scheduling at anytime.  Estimated out-of-pocket cost due at time of visit: $0  Primary Insurance: Medicare  This summary of benefits is an estimation of the patient's out-of-pocket cost. Exact cost may vary based on individual plan coverage.

## 2024-03-08 NOTE — Assessment & Plan Note (Signed)
 On PRN famciclovir , will refill for patient. She denies any recent outbreaks.

## 2024-03-08 NOTE — Assessment & Plan Note (Signed)
 Pt stopped her statin medication, will check new lipid panel for follow up to decide if it needs to be restarted.

## 2024-03-09 ENCOUNTER — Telehealth: Payer: Self-pay | Admitting: Family Medicine

## 2024-03-09 ENCOUNTER — Telehealth: Payer: Self-pay

## 2024-03-09 NOTE — Telephone Encounter (Signed)
 Left a message for the patient to return my call.

## 2024-03-09 NOTE — Telephone Encounter (Signed)
 Copied from CRM [id number]. Topic: Appointments - Appointment Scheduling >> Mar 09, 2024  4:59 PM Juleen Oakland F wrote: Patient returned Lauren Fry phone call

## 2024-03-09 NOTE — Telephone Encounter (Signed)
Pt is returning Lauren Fry call °

## 2024-03-10 NOTE — Telephone Encounter (Signed)
 Patient informed of the message below and a nurses visit appt was scheduled for 5/9.

## 2024-03-10 NOTE — Telephone Encounter (Signed)
 See prior phone note.

## 2024-03-12 ENCOUNTER — Ambulatory Visit (INDEPENDENT_AMBULATORY_CARE_PROVIDER_SITE_OTHER)

## 2024-03-12 DIAGNOSIS — M810 Age-related osteoporosis without current pathological fracture: Secondary | ICD-10-CM | POA: Diagnosis not present

## 2024-03-12 MED ORDER — DENOSUMAB 60 MG/ML ~~LOC~~ SOSY
60.0000 mg | PREFILLED_SYRINGE | Freq: Once | SUBCUTANEOUS | Status: AC
Start: 2024-03-12 — End: 2024-03-12
  Administered 2024-03-12: 60 mg via SUBCUTANEOUS

## 2024-03-12 NOTE — Progress Notes (Signed)
 Patient is in office today for a nurse visit for Prolia injection 60mg . Patient Injection was given in the  Left arm. Patient tolerated injection well.

## 2024-05-25 ENCOUNTER — Ambulatory Visit: Payer: Medicare Other | Admitting: Internal Medicine

## 2024-06-07 ENCOUNTER — Telehealth: Payer: Self-pay | Admitting: *Deleted

## 2024-06-07 ENCOUNTER — Ambulatory Visit

## 2024-06-07 NOTE — Telephone Encounter (Signed)
 Noted

## 2024-06-07 NOTE — Telephone Encounter (Signed)
 Left a detailed message at the patient's cell number to call the office and schedule a nurse's visit for the injection as below.  Message sent to Sarah for ordering.

## 2024-06-07 NOTE — Telephone Encounter (Signed)
-----   Message from Heron CHRISTELLA Sharper sent at 06/04/2024  1:03 PM EDT ----- Regarding: RE: Prolia  That's great! Can we order this for her? Also call the patient and have her come in for her first injection if we haven't already done so. Dr/ Sharper ----- Message ----- From: Eveline Lauraine BRAVO, CMA Sent: 03/05/2024  10:52 AM EDT To: Heron CHRISTELLA Sharper, MD Subject: RE: Prolia                                      Yes! She will be covered.  Patient's that have plain Medicare are usually the best candidates cause they typically can get it at no charge, especially if they have a supplement plan. :) ----- Message ----- From: Sharper Heron CHRISTELLA, MD Sent: 03/05/2024  10:42 AM EDT To: Lauraine BRAVO Eveline, CMA Subject: Prolia                                          Good morning! I have another Prolia  candidate-- we are rechecking DEXA but last t score was -2.6-- can you run it through her insurance to make sure it is covered? Thanks!

## 2024-06-07 NOTE — Telephone Encounter (Signed)
 She had her Prolia  injection on 5/9.

## 2024-07-09 ENCOUNTER — Ambulatory Visit (INDEPENDENT_AMBULATORY_CARE_PROVIDER_SITE_OTHER): Admitting: Family Medicine

## 2024-07-09 ENCOUNTER — Encounter: Payer: Self-pay | Admitting: Family Medicine

## 2024-07-09 VITALS — BP 118/60 | HR 70 | Temp 98.2°F | Ht 65.5 in | Wt 152.0 lb

## 2024-07-09 DIAGNOSIS — R053 Chronic cough: Secondary | ICD-10-CM | POA: Diagnosis not present

## 2024-07-09 NOTE — Progress Notes (Signed)
 Acute Office Visit  Subjective:     Patient ID: Lauren Fry, female    DOB: December 21, 1955, 68 y.o.   MRN: [id number]  Chief Complaint  Patient presents with   Cough    Non-productive, intermittent x8 months, tried OTC cough medications with no relief, seen at an urgent care in August, diagnosed with bronchitis, Rxs given    HPI Patient is in today for  Discussed the use of AI scribe software for clinical note transcription with the patient, who gave verbal consent to proceed.  History of Present Illness   Lauren Fry is a 68 year old female who presents with a persistent cough.  She has experienced a persistent cough since January, initially productive but now non-productive, recurring with similar characteristics each time.  In July, while on a cruise, she experienced the cough and used over-the-counter remedies. In August, she visited urgent care, where a chest x-ray was performed and was normal. She recalls that the urgent care provider mentioned acute bronchitis and prescribed a Z-Pak, which did not alleviate her symptoms. Prior to this, she had been on steroids for a bee sting reaction, which also did not help the cough.  She has tried various over-the-counter medications, including expectorants, Vicks cough syrup at night, and Benadryl, suspecting allergies or asthma. She describes a sensation of a lump in her throat, more prominent when swallowing, and occasional chest 'pings'.  She experiences significant postnasal drainage and notes that the cough is not present at night but starts when she begins moving in the morning. No difficulty breathing, chest tightness, or shortness of breath, but she mentions a sour taste in her mouth, which she associates with bad breath.  Her social history includes a past smoking history of ten years, having quit in 1984. She has no family history of asthma.      Review of Systems  All other systems reviewed and are negative.        Objective:    BP 118/60   Pulse 70   Temp 98.2 F (36.8 C) (Oral)   Ht 5' 5.5 (1.664 m)   Wt 152 lb (68.9 kg)   SpO2 97%   BMI 24.91 kg/m    Physical Exam Vitals reviewed.  Constitutional:      Appearance: Normal appearance. She is well-groomed and normal weight.  Cardiovascular:     Rate and Rhythm: Normal rate and regular rhythm.     Heart sounds: S1 normal and S2 normal.  Pulmonary:     Effort: Pulmonary effort is normal.     Breath sounds: Normal breath sounds and air entry.  Lymphadenopathy:     Cervical: No cervical adenopathy.  Neurological:     Mental Status: She is alert and oriented to person, place, and time. Mental status is at baseline.     Gait: Gait is intact.  Psychiatric:        Mood and Affect: Affect normal.        Speech: Speech normal.     No results found for any visits on 07/09/24.      Assessment & Plan:   Problem List Items Addressed This Visit   None Visit Diagnoses       Chronic cough    -  Primary      Assessment and Plan    Chronic cough Chronic cough since January with differential diagnosis of postnasal drip, acid reflux, and cough variant asthma. Low lung cancer risk due to minimal smoking history and long  cessation. - Recommended flonase OTC with daily antihistamine -- try this for 2 weeks. If no improvement then she will start on famotidine 20 mg BID for 2 weeks. Then she will let me know the results.  - Obtain August chest x-ray report. - Consider pulmonology referral for pulmonary function tests if asthma suspected. - Consider ENT referral if symptoms persist.     20 mintutes spent with patient today including obtaining history and counseling her on the  potenial etiologies and work up.  No orders of the defined types were placed in this encounter.   No follow-ups on file.  Heron CHRISTELLA Sharper, MD

## 2024-07-09 NOTE — Patient Instructions (Signed)
 Flonase 2 sprays per nostril daily at bedtime for 2 whole weeks, along with claritin or zyrtec once daily.   If that doesn't work, start pepcid  20 mg twice a day for 2 whole weeks. Then message me and let me know if it works.

## 2024-07-21 ENCOUNTER — Other Ambulatory Visit: Payer: Self-pay

## 2024-07-21 DIAGNOSIS — M810 Age-related osteoporosis without current pathological fracture: Secondary | ICD-10-CM

## 2024-07-21 MED ORDER — DENOSUMAB 60 MG/ML ~~LOC~~ SOSY: 60.0000 mg | PREFILLED_SYRINGE | SUBCUTANEOUS | Status: AC

## 2024-07-21 NOTE — Progress Notes (Signed)
 Pt on Bone Density Report. Order placed for PA.

## 2024-08-12 ENCOUNTER — Telehealth: Payer: Self-pay

## 2024-08-12 ENCOUNTER — Other Ambulatory Visit (HOSPITAL_COMMUNITY): Payer: Self-pay

## 2024-08-12 NOTE — Telephone Encounter (Signed)
 Pt ready for scheduling for PROLIA  on or after : 09/12/24  Option# 1: Buy/Bill (Office supplied medication)  Out-of-pocket cost due at time of clinic visit: $0  Number of injection/visits approved: ---  Primary: MEDICARE Prolia  co-insurance: 0% Admin fee co-insurance: 0%  Secondary: [employer]-MED[place] Prolia  co-insurance:  Admin fee co-insurance:   Medical Benefit Details: Date Benefits were checked: 08/12/24 Deductible: $257 Met of $257 Required/ Coinsurance: 0%/ Admin Fee: 0%  Prior Auth: N/A PA# Expiration Date:   # of doses approved: ----------------------------------------------------------------------- Option# 2- Med Obtained from pharmacy:  Pharmacy benefit: Copay $900.08 (Paid to pharmacy) Admin Fee: 0% (Pay at clinic)  Prior Auth: N/A PA# Expiration Date:   # of doses approved:   If patient wants fill through the pharmacy benefit please send prescription to: [facility], and include estimated need by date in rx notes. Pharmacy will ship medication directly to the office.  Patient NOT eligible for Prolia  Copay Card. Copay Card can make patient's cost as little as $25. Link to apply: [email or web address]://[email or web address]  ** This summary of benefits is an estimation of the patient's out-of-pocket cost. Exact cost may very based on individual plan coverage.

## 2024-08-12 NOTE — Telephone Encounter (Signed)
 Prolia  VOB initiated via [email or web address]  Next Prolia  inj DUE: 09/12/24

## 2024-08-12 NOTE — Telephone Encounter (Signed)
 SABRA

## 2024-08-23 ENCOUNTER — Other Ambulatory Visit: Payer: Self-pay | Admitting: Nurse Practitioner

## 2024-08-23 DIAGNOSIS — Z1231 Encounter for screening mammogram for malignant neoplasm of breast: Secondary | ICD-10-CM

## 2024-09-07 ENCOUNTER — Ambulatory Visit
Admission: RE | Admit: 2024-09-07 | Discharge: 2024-09-07 | Disposition: A | Discharge status: Home / Self Care | Source: Ambulatory Visit | Attending: Nurse Practitioner | Admitting: Nurse Practitioner

## 2024-09-07 DIAGNOSIS — Z1231 Encounter for screening mammogram for malignant neoplasm of breast: Secondary | ICD-10-CM

## 2024-09-10 ENCOUNTER — Ambulatory Visit: Payer: Self-pay | Admitting: Family Medicine

## 2024-11-05 ENCOUNTER — Ambulatory Visit: Admitting: Family Medicine

## 2024-11-08 ENCOUNTER — Ambulatory Visit (INDEPENDENT_AMBULATORY_CARE_PROVIDER_SITE_OTHER): Admitting: Family Medicine

## 2024-11-08 ENCOUNTER — Ambulatory Visit: Payer: Self-pay | Admitting: Family Medicine

## 2024-11-08 ENCOUNTER — Encounter: Payer: Self-pay | Admitting: Family Medicine

## 2024-11-08 VITALS — BP 102/60 | HR 58 | Temp 98.1°F | Ht 65.5 in | Wt 156.2 lb

## 2024-11-08 DIAGNOSIS — M810 Age-related osteoporosis without current pathological fracture: Secondary | ICD-10-CM | POA: Diagnosis not present

## 2024-11-08 DIAGNOSIS — F325 Major depressive disorder, single episode, in full remission: Secondary | ICD-10-CM | POA: Diagnosis not present

## 2024-11-08 DIAGNOSIS — Z131 Encounter for screening for diabetes mellitus: Secondary | ICD-10-CM | POA: Diagnosis not present

## 2024-11-08 DIAGNOSIS — Z0000 Encounter for general adult medical examination without abnormal findings: Secondary | ICD-10-CM

## 2024-11-08 DIAGNOSIS — E559 Vitamin D deficiency, unspecified: Secondary | ICD-10-CM | POA: Diagnosis not present

## 2024-11-08 DIAGNOSIS — Z23 Encounter for immunization: Secondary | ICD-10-CM | POA: Diagnosis not present

## 2024-11-08 LAB — CBC WITH DIFFERENTIAL/PLATELET
Basophils Absolute: 0.1 K/uL (ref 0.0–0.1)
Basophils Relative: 1.2 % (ref 0.0–3.0)
Eosinophils Absolute: 0.1 K/uL (ref 0.0–0.7)
Eosinophils Relative: 2.2 % (ref 0.0–5.0)
HCT: 42.9 % (ref 36.0–46.0)
Hemoglobin: 14.4 g/dL (ref 12.0–15.0)
Lymphocytes Relative: 22.6 % (ref 12.0–46.0)
Lymphs Abs: 1.2 K/uL (ref 0.7–4.0)
MCHC: 33.5 g/dL (ref 30.0–36.0)
MCV: 93.2 fl (ref 78.0–100.0)
Monocytes Absolute: 0.4 K/uL (ref 0.1–1.0)
Monocytes Relative: 7.9 % (ref 3.0–12.0)
Neutro Abs: 3.7 K/uL (ref 1.4–7.7)
Neutrophils Relative %: 66.1 % (ref 43.0–77.0)
Platelets: 202 K/uL (ref 150.0–400.0)
RBC: 4.61 Mil/uL (ref 3.87–5.11)
RDW: 13.4 % (ref 11.5–15.5)
WBC: 5.5 K/uL (ref 4.0–10.5)

## 2024-11-08 LAB — COMPREHENSIVE METABOLIC PANEL WITH GFR
ALT: 14 U/L (ref 3–35)
AST: 19 U/L (ref 5–37)
Albumin: 4.3 g/dL (ref 3.5–5.2)
Alkaline Phosphatase: 46 U/L (ref 39–117)
BUN: 21 mg/dL (ref 6–23)
CO2: 29 meq/L (ref 19–32)
Calcium: 9 mg/dL (ref 8.4–10.5)
Chloride: 104 meq/L (ref 96–112)
Creatinine, Ser: 0.82 mg/dL (ref 0.40–1.20)
GFR: 72.56 mL/min
Glucose, Bld: 94 mg/dL (ref 70–99)
Potassium: 4.4 meq/L (ref 3.5–5.1)
Sodium: 139 meq/L (ref 135–145)
Total Bilirubin: 0.5 mg/dL (ref 0.2–1.2)
Total Protein: 6.7 g/dL (ref 6.0–8.3)

## 2024-11-08 LAB — VITAMIN D 25 HYDROXY (VIT D DEFICIENCY, FRACTURES): VITD: 44.79 ng/mL (ref 30.00–100.00)

## 2024-11-08 LAB — HEMOGLOBIN A1C: Hgb A1c MFr Bld: 5.6 % (ref 4.6–6.5)

## 2024-11-08 MED ORDER — BUPROPION HCL ER (XL) 150 MG PO TB24: 150.0000 mg | ORAL_TABLET | Freq: Every day | ORAL | 1 refills | Status: AC

## 2024-11-08 MED ORDER — CITALOPRAM HYDROBROMIDE 10 MG PO TABS: 10.0000 mg | ORAL_TABLET | Freq: Every day | ORAL | 1 refills | Status: AC

## 2024-11-08 NOTE — Progress Notes (Signed)
 "  Chief Complaint  Patient presents with   Medical Management of Chronic Issues     Subjective:   Lauren Fry is a 69 y.o. female who presents for a Medicare Annual Wellness Visit.  Visit info / Clinical Intake: Medicare Wellness Visit Type:: Initial Annual Wellness Visit Persons participating in visit and providing information:: patient Medicare Wellness Visit Mode:: In-person (required for WTM) Interpreter Needed?: No Pre-visit prep was completed: no AWV questionnaire completed by patient prior to visit?: no Living arrangements:: lives with spouse/significant other Patient's Overall Health Status Rating: very good Typical amount of pain: none (occasional aches and pains) Does pain affect daily life?: no Are you currently prescribed opioids?: no  Dietary Habits and Nutritional Risks How many meals a day?: 3 Eats fruit and vegetables daily?: yes Most meals are obtained by: preparing own meals In the last 2 weeks, have you had any of the following?: none Diabetic:: no  Functional Status Activities of Daily Living (to include ambulation/medication): Independent Ambulation: Independent Medication Administration: Independent Home Management (perform basic housework or laundry): Independent Manage your own finances?: yes Primary transportation is: driving Concerns about vision?: no *vision screening is required for WTM* Concerns about hearing?: no  Fall Screening Falls in the past year?: 0 Number of falls in past year: 0 Was there an injury with Fall?: 0 Fall Risk Category Calculator: 0 Patient Fall Risk Level: Low Fall Risk  Fall Risk Patient at Risk for Falls Due to: No Fall Risks Fall risk Follow up: Falls evaluation completed  Home and Transportation Safety: All rugs have non-skid backing?: yes All stairs or steps have railings?: yes Grab bars in the bathtub or shower?: (!) no Have non-skid surface in bathtub or shower?: yes Good home lighting?: yes Regular  seat belt use?: yes Hospital stays in the last year:: no  Cognitive Assessment Difficulty concentrating, remembering, or making decisions? : no Will 6CIT or Mini Cog be Completed: no 6CIT or Mini Cog Declined: patient alert, oriented, able to answer questions appropriately and recall recent events  Advance Directives (For Healthcare) Does Patient Have a Medical Advance Directive?: Yes Does patient want to make changes to medical advance directive?: No - Patient declined Type of Advance Directive: Living will Copy of Living Will in Chart?: No - copy requested  Reviewed/Updated  Reviewed/Updated: Reviewed All (Medical, Surgical, Family, Medications, Allergies, Care Teams, Patient Goals)    Allergies (verified) Patient has no known allergies.   Current Medications (verified) Outpatient Encounter Medications as of 11/08/2024  Medication Sig   aspirin 81 MG chewable tablet Chew 81 mg by mouth daily.   buPROPion  (WELLBUTRIN  XL) 150 MG 24 hr tablet Take 1 tablet (150 mg total) by mouth daily.   Cholecalciferol (VITAMIN D  PO) Take 5,000 Units by mouth daily.    citalopram  (CELEXA ) 10 MG tablet Take 1 tablet (10 mg total) by mouth daily.   MAGNESIUM PO Take by mouth.   Milk Thistle 250 MG CAPS Take 1 capsule (250 mg total) by mouth daily.   Omega-3 Fatty Acids (FISH OIL PO) Take by mouth daily.   TURMERIC PO Take by mouth.   vitamin C (ASCORBIC ACID) 500 MG tablet Take 500 mg by mouth daily.   zinc gluconate 50 MG tablet Take 50 mg by mouth daily.   [DISCONTINUED] citalopram  (CELEXA ) 20 MG tablet Take 20 mg by mouth daily.   Facility-Administered Encounter Medications as of 11/08/2024  Medication   denosumab  (PROLIA ) injection 60 mg    History: Past Medical History:  Diagnosis Date   Anxiety    Breast mass, right    HSV-2 (herpes simplex virus 2) infection    Hyperlipidemia    Peripheral vascular disease    varicose veins   Unspecified vitamin D  deficiency    Varicose veins     Past Surgical History:  Procedure Laterality Date   BREAST BIOPSY Left 01/18/2020   benign   BREAST BIOPSY Left 09/04/2023   BREAST BIOPSY Left 09/04/2023   US  LT BREAST BX W LOC DEV 1ST LESION IMG BX SPEC US  GUIDE 09/04/2023 [facility]   BREAST EXCISIONAL BIOPSY Right 02/25/2017   benign   BREAST LUMPECTOMY WITH RADIOACTIVE SEED LOCALIZATION Right 02/25/2017   Procedure: RIGHT BREAST LUMPECTOMY WITH RADIOACTIVE SEED LOCALIZATION;  Surgeon: Donnice Bury, MD;  Location: [facility];  Service: General;  Laterality: Right;   LAPAROSCOPIC VAGINAL HYSTERECTOMY WITH SALPINGO OOPHORECTOMY Bilateral 11/24/2019   Procedure: LAPAROSCOPIC ASSISTED VAGINAL HYSTERECTOMY WITH SALPINGO OOPHORECTOMY;  Surgeon: Rosalva Sawyer, MD;  Location: [facility];  Service: Gynecology;  Laterality: Bilateral;   LASIK  1990   LASIK  1990   LEEP N/A 02/04/2019   Procedure: LOOP ELECTROSURGICAL EXCISION PROCEDURE (LEEP);  Surgeon: Rosalva Sawyer, MD;  Location: [facility];  Service: Gynecology;  Laterality: N/A;   Family History  Problem Relation Age of Onset   Cancer Mother 18       lung   Dementia Father    Cancer Sister 74       colon   Cancer Brother 42       colon   Social History   Occupational History   Not on file  Tobacco Use   Smoking status: Former    Current packs/day: 0.00    Types: Cigarettes    Quit date: 11/04/1982    Years since quitting: 42.0   Smokeless tobacco: Never  Vaping Use   Vaping status: Never Used  Substance and Sexual Activity   Alcohol use: Yes    Alcohol/week: 4.0 standard drinks of alcohol    Types: 4 Glasses of wine per week    Comment: social   Drug use: No   Sexual activity: Yes    Birth control/protection: None   Tobacco Counseling Counseling given: Not Answered  SDOH Screenings   Food Insecurity: No Food Insecurity (11/08/2024)  Housing: Unknown (11/08/2024)  Transportation Needs: No Transportation Needs  (11/08/2024)  Alcohol Screen: Low Risk (07/08/2024)  Depression (PHQ2-9): Low Risk (11/08/2024)  Financial Resource Strain: Patient Declined (11/07/2024)  Physical Activity: Insufficiently Active (11/07/2024)  Social Connections: Unknown (11/07/2024)  Stress: No Stress Concern Present (11/08/2024)  Tobacco Use: Medium Risk (11/08/2024)  Health Literacy: Adequate Health Literacy (11/08/2024)   See flowsheets for full screening details  Depression Screen PHQ 2 & 9 Depression Scale- Over the past 2 weeks, how often have you been bothered by any of the following problems? Little interest or pleasure in doing things: 0 Feeling down, depressed, or hopeless (PHQ Adolescent also includes...irritable): 0 PHQ-2 Total Score: 0 Trouble falling or staying asleep, or sleeping too much: 0 Feeling tired or having little energy: 0 Poor appetite or overeating (PHQ Adolescent also includes...weight loss): 0 Feeling bad about yourself - or that you are a failure or have let yourself or your family down: 0 Trouble concentrating on things, such as reading the newspaper or watching television (PHQ Adolescent also includes...like school work): 0 Moving or speaking so slowly that other people could have noticed. Or the opposite - being  so fidgety or restless that you have been moving around a lot more than usual: 0 Thoughts that you would be better off dead, or of hurting yourself in some way: 0 PHQ-9 Total Score: 0     Goals Addressed   None          Objective:    Today's Vitals   11/08/24 0933  BP: 102/60  Pulse: (!) 58  Temp: 98.1 F (36.7 C)  TempSrc: Oral  SpO2: 98%  Weight: 156 lb 3.2 oz (70.9 kg)  Height: 5' 5.5 (1.664 m)   Body mass index is 25.6 kg/m.  Hearing/Vision screen No results found. Immunizations and Health Maintenance Health Maintenance  Topic Date Due   COVID-19 Vaccine (5 - 2025-26 season) 07/05/2024   Mammogram  09/07/2025   Medicare Annual Wellness (AWV)  11/08/2025    Colonoscopy  08/27/2028   DTaP/Tdap/Td (4 - Td or Tdap) 11/16/2033   Pneumococcal Vaccine: 50+ Years  Completed   Influenza Vaccine  Completed   Bone Density Scan  Completed   Hepatitis C Screening  Completed   Zoster Vaccines- Shingrix  Completed   Meningococcal B Vaccine  Aged Out   Physical Exam Vitals reviewed.  Constitutional:      Appearance: Normal appearance. She is normal weight.  HENT:     Right Ear: Tympanic membrane and ear canal normal.     Left Ear: Tympanic membrane and ear canal normal.     Mouth/Throat:     Mouth: Mucous membranes are moist.     Pharynx: No posterior oropharyngeal erythema.  Eyes:     Conjunctiva/sclera: Conjunctivae normal.  Neck:     Thyroid : No thyromegaly.  Cardiovascular:     Pulses: Normal pulses.     Heart sounds: Normal heart sounds. No murmur heard. Pulmonary:     Effort: Pulmonary effort is normal.     Breath sounds: Normal breath sounds. No wheezing.  Abdominal:     General: Bowel sounds are normal.     Palpations: Abdomen is soft.  Musculoskeletal:     Right lower leg: No edema.     Left lower leg: No edema.  Lymphadenopathy:     Cervical: No cervical adenopathy.  Neurological:     Mental Status: She is alert and oriented to person, place, and time. Mental status is at baseline.  Psychiatric:        Mood and Affect: Mood normal.        Behavior: Behavior normal.         Assessment/Plan:  This is a routine wellness examination for Pollie.  Patient Care Team: Ozell Heron HERO, MD as PCP - General (Family Medicine) Rosalie Kitchens, MD as Consulting Physician (Gastroenterology)  I have personally reviewed and noted the following in the patients chart:   Medical and social history Use of alcohol, tobacco or illicit drugs  Current medications and supplements including opioid prescriptions. Functional ability and status Nutritional status Physical activity Advanced directives List of other  physicians Hospitalizations, surgeries, and ER visits in previous 12 months Vitals Screenings to include cognitive, depression, and falls Referrals and appointments  Orders Placed This Encounter  Procedures   Flu vaccine HIGH DOSE PF(Fluzone Trivalent)   CMP    Standing Status:   Future    Number of Occurrences:   1    Expiration Date:   11/08/2025   CBC with Differential/Platelet    Standing Status:   Future    Number of Occurrences:   1  Expiration Date:   11/08/2025   Hemoglobin A1c    Standing Status:   Future    Number of Occurrences:   1    Expiration Date:   11/08/2025   VITAMIN D  25 Hydroxy (Vit-D Deficiency, Fractures)    Standing Status:   Future    Number of Occurrences:   1    Expiration Date:   11/08/2025   In addition, I have reviewed and discussed with patient certain preventive protocols, quality metrics, and best practice recommendations. A written personalized care plan for preventive services as well as general preventive health recommendations were provided to patient.   Heron CHRISTELLA Sharper, MD   11/09/2024   Return in 1 year (on 11/08/2025).  After Visit Summary: (In Person-Printed) AVS printed and given to the patient  Nurse Notes: N/A "

## 2024-11-15 NOTE — Progress Notes (Signed)
 Multiple attempts have been made to patient through phone calls and MyChart messages regarding scheduling Prolia  for patient. Patient has not returned calls or MyChart messages. Patient recently seen on 11/08/2024. Please advise.

## 2024-11-16 ENCOUNTER — Encounter: Payer: Medicare Other | Admitting: Nurse Practitioner

## 2024-11-17 ENCOUNTER — Telehealth: Payer: Self-pay

## 2024-11-17 NOTE — Telephone Encounter (Signed)
 Letter mailed to patient home address in regards to contacting office to schedule Prolia  injection. Several attempts have been made to contact patient.

## 2024-11-25 ENCOUNTER — Other Ambulatory Visit

## 2024-11-25 ENCOUNTER — Ambulatory Visit (HOSPITAL_BASED_OUTPATIENT_CLINIC_OR_DEPARTMENT_OTHER)
Admission: RE | Admit: 2024-11-25 | Discharge: 2024-11-25 | Disposition: A | Discharge status: Home / Self Care | Source: Ambulatory Visit | Attending: Family Medicine | Admitting: Family Medicine

## 2024-11-25 DIAGNOSIS — Z780 Asymptomatic menopausal state: Secondary | ICD-10-CM | POA: Diagnosis present

## 2024-11-26 ENCOUNTER — Ambulatory Visit: Payer: Self-pay | Admitting: Family Medicine

## 2024-11-30 ENCOUNTER — Telehealth: Payer: Self-pay | Admitting: *Deleted

## 2024-11-30 NOTE — Telephone Encounter (Signed)
 See Mychart message

## 2024-11-30 NOTE — Telephone Encounter (Signed)
 Copied from CRM #[id number]. Topic: Clinical - Medical Advice >> Nov 30, 2024 11:41 AM Herma G wrote: Reason for CRM: Pt requested a call back at [phone] to discuss medication needed due to recent bone density test results.

## 2025-11-09 ENCOUNTER — Encounter: Admitting: Family Medicine
